# Patient Record
Sex: Male | Born: 1935 | Race: Black or African American | Hispanic: No | Marital: Single | Smoking: Former smoker
Health system: Southern US, Community
[De-identification: ages and names within clinical notes are randomized; demographics above are authoritative.]

## PROBLEM LIST (undated history)

## (undated) DIAGNOSIS — I472 Ventricular tachycardia: Secondary | ICD-10-CM

## (undated) DIAGNOSIS — Z950 Presence of cardiac pacemaker: Secondary | ICD-10-CM

## (undated) DIAGNOSIS — F329 Major depressive disorder, single episode, unspecified: Secondary | ICD-10-CM

## (undated) DIAGNOSIS — I2510 Atherosclerotic heart disease of native coronary artery without angina pectoris: Secondary | ICD-10-CM

## (undated) DIAGNOSIS — N189 Chronic kidney disease, unspecified: Secondary | ICD-10-CM

## (undated) DIAGNOSIS — I714 Abdominal aortic aneurysm, without rupture, unspecified: Secondary | ICD-10-CM

## (undated) DIAGNOSIS — J45909 Unspecified asthma, uncomplicated: Secondary | ICD-10-CM

## (undated) DIAGNOSIS — I4891 Unspecified atrial fibrillation: Secondary | ICD-10-CM

## (undated) DIAGNOSIS — M6281 Muscle weakness (generalized): Secondary | ICD-10-CM

## (undated) DIAGNOSIS — I5022 Chronic systolic (congestive) heart failure: Secondary | ICD-10-CM

## (undated) DIAGNOSIS — M1990 Unspecified osteoarthritis, unspecified site: Secondary | ICD-10-CM

## (undated) DIAGNOSIS — N2581 Secondary hyperparathyroidism of renal origin: Secondary | ICD-10-CM

## (undated) DIAGNOSIS — M109 Gout, unspecified: Secondary | ICD-10-CM

## (undated) DIAGNOSIS — N400 Enlarged prostate without lower urinary tract symptoms: Secondary | ICD-10-CM

## (undated) DIAGNOSIS — E119 Type 2 diabetes mellitus without complications: Secondary | ICD-10-CM

## (undated) DIAGNOSIS — I420 Dilated cardiomyopathy: Secondary | ICD-10-CM

## (undated) DIAGNOSIS — I10 Essential (primary) hypertension: Secondary | ICD-10-CM

## (undated) DIAGNOSIS — J449 Chronic obstructive pulmonary disease, unspecified: Secondary | ICD-10-CM

## (undated) DIAGNOSIS — K649 Unspecified hemorrhoids: Secondary | ICD-10-CM

## (undated) DIAGNOSIS — J189 Pneumonia, unspecified organism: Secondary | ICD-10-CM

## (undated) DIAGNOSIS — F32A Depression, unspecified: Secondary | ICD-10-CM

## (undated) DIAGNOSIS — I4729 Other ventricular tachycardia: Secondary | ICD-10-CM

## (undated) HISTORY — PX: OTHER SURGICAL HISTORY: SHX169

## (undated) HISTORY — DX: Other ventricular tachycardia: I47.29

## (undated) HISTORY — DX: Unspecified osteoarthritis, unspecified site: M19.90

## (undated) HISTORY — DX: Unspecified atrial fibrillation: I48.91

## (undated) HISTORY — DX: Gout, unspecified: M10.9

## (undated) HISTORY — DX: Pneumonia, unspecified organism: J18.9

## (undated) HISTORY — PX: STOMACH SURGERY: SHX791

## (undated) HISTORY — DX: Dilated cardiomyopathy: I42.0

## (undated) HISTORY — DX: Atherosclerotic heart disease of native coronary artery without angina pectoris: I25.10

## (undated) HISTORY — DX: Abdominal aortic aneurysm, without rupture, unspecified: I71.40

## (undated) HISTORY — DX: Unspecified hemorrhoids: K64.9

## (undated) HISTORY — DX: Abdominal aortic aneurysm, without rupture: I71.4

## (undated) HISTORY — PX: CARDIAC CATHETERIZATION: SHX172

## (undated) HISTORY — DX: Unspecified asthma, uncomplicated: J45.909

## (undated) HISTORY — DX: Type 2 diabetes mellitus without complications: E11.9

## (undated) HISTORY — PX: INSERT / REPLACE / REMOVE PACEMAKER: SUR710

## (undated) HISTORY — DX: Essential (primary) hypertension: I10

## (undated) HISTORY — DX: Chronic systolic (congestive) heart failure: I50.22

## (undated) HISTORY — DX: Ventricular tachycardia: I47.2

## (undated) HISTORY — DX: Benign prostatic hyperplasia without lower urinary tract symptoms: N40.0

---

## 2013-07-09 ENCOUNTER — Inpatient Hospital Stay: Payer: Self-pay | Admitting: Specialist

## 2013-07-09 LAB — COMPREHENSIVE METABOLIC PANEL
Albumin: 3.2 g/dL — ABNORMAL LOW (ref 3.4–5.0)
Alkaline Phosphatase: 112 U/L (ref 50–136)
BUN: 39 mg/dL — ABNORMAL HIGH (ref 7–18)
Calcium, Total: 9.1 mg/dL (ref 8.5–10.1)
Chloride: 106 mmol/L (ref 98–107)
EGFR (African American): 53 — ABNORMAL LOW
Osmolality: 290 (ref 275–301)
SGOT(AST): 38 U/L — ABNORMAL HIGH (ref 15–37)
Sodium: 140 mmol/L (ref 136–145)
Total Protein: 7.2 g/dL (ref 6.4–8.2)

## 2013-07-09 LAB — CK TOTAL AND CKMB (NOT AT ARMC)
CK, Total: 169 U/L (ref 35–232)
CK-MB: 8.6 ng/mL — ABNORMAL HIGH (ref 0.5–3.6)

## 2013-07-09 LAB — CBC
HCT: 36 % — ABNORMAL LOW (ref 40.0–52.0)
HGB: 11.5 g/dL — ABNORMAL LOW (ref 13.0–18.0)
MCHC: 32.1 g/dL (ref 32.0–36.0)
RBC: 3.84 10*6/uL — ABNORMAL LOW (ref 4.40–5.90)
RDW: 19.5 % — ABNORMAL HIGH (ref 11.5–14.5)
WBC: 5 10*3/uL (ref 3.8–10.6)

## 2013-07-09 LAB — PRO B NATRIURETIC PEPTIDE: B-Type Natriuretic Peptide: 11556 pg/mL — ABNORMAL HIGH (ref 0–450)

## 2013-07-10 DIAGNOSIS — J9600 Acute respiratory failure, unspecified whether with hypoxia or hypercapnia: Secondary | ICD-10-CM

## 2013-07-10 DIAGNOSIS — I4891 Unspecified atrial fibrillation: Secondary | ICD-10-CM

## 2013-07-10 DIAGNOSIS — I059 Rheumatic mitral valve disease, unspecified: Secondary | ICD-10-CM

## 2013-07-10 LAB — LIPID PANEL
Cholesterol: 115 mg/dL (ref 0–200)
Ldl Cholesterol, Calc: 64 mg/dL (ref 0–100)
Triglycerides: 53 mg/dL (ref 0–200)
VLDL Cholesterol, Calc: 11 mg/dL (ref 5–40)

## 2013-07-10 LAB — TSH: Thyroid Stimulating Horm: 0.816 u[IU]/mL

## 2013-07-10 LAB — BASIC METABOLIC PANEL
Anion Gap: 4 — ABNORMAL LOW (ref 7–16)
Chloride: 105 mmol/L (ref 98–107)
Co2: 31 mmol/L (ref 21–32)
Creatinine: 1.54 mg/dL — ABNORMAL HIGH (ref 0.60–1.30)
EGFR (Non-African Amer.): 42 — ABNORMAL LOW
Glucose: 164 mg/dL — ABNORMAL HIGH (ref 65–99)
Sodium: 140 mmol/L (ref 136–145)

## 2013-07-10 LAB — CBC WITH DIFFERENTIAL/PLATELET
Basophil %: 0.2 %
Lymphocyte #: 0.4 10*3/uL — ABNORMAL LOW (ref 1.0–3.6)
Lymphocyte %: 7.3 %
MCV: 95 fL (ref 80–100)
Monocyte #: 0.3 x10 3/mm (ref 0.2–1.0)
Platelet: 109 10*3/uL — ABNORMAL LOW (ref 150–440)
RDW: 18.5 % — ABNORMAL HIGH (ref 11.5–14.5)

## 2013-07-10 LAB — CK TOTAL AND CKMB (NOT AT ARMC)
CK, Total: 114 U/L (ref 35–232)
CK-MB: 5.6 ng/mL — ABNORMAL HIGH (ref 0.5–3.6)
CK-MB: 6.2 ng/mL — ABNORMAL HIGH (ref 0.5–3.6)

## 2013-07-10 LAB — MAGNESIUM: Magnesium: 2.4 mg/dL

## 2013-07-10 LAB — TROPONIN I: Troponin-I: 0.03 ng/mL

## 2013-07-11 DIAGNOSIS — R079 Chest pain, unspecified: Secondary | ICD-10-CM

## 2013-07-11 LAB — BASIC METABOLIC PANEL
Anion Gap: 3 — ABNORMAL LOW (ref 7–16)
BUN: 36 mg/dL — ABNORMAL HIGH (ref 7–18)
Calcium, Total: 9.1 mg/dL (ref 8.5–10.1)
Co2: 31 mmol/L (ref 21–32)
EGFR (Non-African Amer.): 46 — ABNORMAL LOW
Osmolality: 279 (ref 275–301)
Potassium: 5 mmol/L (ref 3.5–5.1)
Sodium: 134 mmol/L — ABNORMAL LOW (ref 136–145)

## 2013-07-11 LAB — TROPONIN I
Troponin-I: 0.02 ng/mL
Troponin-I: <0.02 ng/mL

## 2013-07-11 LAB — CBC WITH DIFFERENTIAL/PLATELET
Basophil #: 0 10*3/uL (ref 0.0–0.1)
Eosinophil #: 0 10*3/uL (ref 0.0–0.7)
HCT: 33.3 % — ABNORMAL LOW (ref 40.0–52.0)
HGB: 11.1 g/dL — ABNORMAL LOW (ref 13.0–18.0)
Lymphocyte %: 7.5 %
MCH: 31.5 pg (ref 26.0–34.0)
Monocyte %: 3.6 %
Neutrophil #: 4.3 10*3/uL (ref 1.4–6.5)
Neutrophil %: 88.8 %
RDW: 18.4 % — ABNORMAL HIGH (ref 11.5–14.5)

## 2013-07-12 DIAGNOSIS — I509 Heart failure, unspecified: Secondary | ICD-10-CM

## 2013-07-12 LAB — CBC WITH DIFFERENTIAL/PLATELET
Basophil %: 0.1 %
Eosinophil %: 0 %
HGB: 11.2 g/dL — ABNORMAL LOW (ref 13.0–18.0)
Lymphocyte %: 5.6 %
MCHC: 32.9 g/dL (ref 32.0–36.0)
MCV: 95 fL (ref 80–100)
Monocyte %: 3.7 %
RBC: 3.58 10*6/uL — ABNORMAL LOW (ref 4.40–5.90)
RDW: 18.3 % — ABNORMAL HIGH (ref 11.5–14.5)

## 2013-07-12 LAB — BASIC METABOLIC PANEL
Anion Gap: 6 — ABNORMAL LOW (ref 7–16)
BUN: 38 mg/dL — ABNORMAL HIGH (ref 7–18)
Calcium, Total: 9.3 mg/dL (ref 8.5–10.1)
Chloride: 98 mmol/L (ref 98–107)
EGFR (African American): 51 — ABNORMAL LOW
Glucose: 184 mg/dL — ABNORMAL HIGH (ref 65–99)
Sodium: 136 mmol/L (ref 136–145)

## 2013-07-13 LAB — BASIC METABOLIC PANEL
Anion Gap: 2 — ABNORMAL LOW (ref 7–16)
Chloride: 96 mmol/L — ABNORMAL LOW (ref 98–107)
Glucose: 167 mg/dL — ABNORMAL HIGH (ref 65–99)
Osmolality: 284 (ref 275–301)
Potassium: 4.1 mmol/L (ref 3.5–5.1)

## 2013-07-14 LAB — BASIC METABOLIC PANEL
Anion Gap: 3 — ABNORMAL LOW (ref 7–16)
BUN: 39 mg/dL — ABNORMAL HIGH (ref 7–18)
Co2: 41 mmol/L (ref 21–32)
Glucose: 130 mg/dL — ABNORMAL HIGH (ref 65–99)

## 2013-07-15 ENCOUNTER — Telehealth: Payer: Self-pay

## 2013-07-15 DIAGNOSIS — I5021 Acute systolic (congestive) heart failure: Secondary | ICD-10-CM

## 2013-07-15 LAB — BASIC METABOLIC PANEL
Anion Gap: 0 — ABNORMAL LOW (ref 7–16)
BUN: 40 mg/dL — ABNORMAL HIGH (ref 7–18)
Co2: 43 mmol/L (ref 21–32)
Creatinine: 1.35 mg/dL — ABNORMAL HIGH (ref 0.60–1.30)
EGFR (African American): 57 — ABNORMAL LOW
EGFR (Non-African Amer.): 49 — ABNORMAL LOW
Glucose: 196 mg/dL — ABNORMAL HIGH (ref 65–99)
Osmolality: 285 (ref 275–301)
Potassium: 4.1 mmol/L (ref 3.5–5.1)
Sodium: 135 mmol/L — ABNORMAL LOW (ref 136–145)

## 2013-07-15 NOTE — Telephone Encounter (Signed)
Called pt to schedule tcm/ph appt 1-2 wks. /CHF, h/o afib.

## 2013-07-17 ENCOUNTER — Telehealth: Payer: Self-pay | Admitting: *Deleted

## 2013-07-17 NOTE — Telephone Encounter (Signed)
Patient contacted regarding discharge from [facility] on 07/15/13.  Patient understands to follow up with provider Alinda Money PA on 07/22/13 at 1:30 at [facility] office. Patient understands discharge instructions? yes Patient understands medications and regiment? yes Patient understands to bring all medications to this visit? yes  I spoke with patients sister. Mylo Red RN

## 2013-07-21 ENCOUNTER — Encounter: Payer: Self-pay | Admitting: *Deleted

## 2013-07-21 ENCOUNTER — Telehealth: Payer: Self-pay

## 2013-07-21 NOTE — Telephone Encounter (Signed)
Dr Kizzie Ide from [facility] is seeing pt today in office.  Pt was discharged from [facility] on 07/15/13 CHF exacerbation.  Pt's discharge weight was 211 and today in office weight is 192.  Pt's family reports BP 70/40 at home, BP today in office 86/52.  Pt is on Torsemide 20mg  BID, Coreg 3.125mg  BID, Lisinopril 2.5mg  QD, and Amiodarone 200mg  BID.  Pt has appt to see Alinda Money, [place] in office tomorrow. She wants pt to hold Torsemide until seen.  Discussed with Alinda Money and per his instructions pt advised by Dr Kizzie Ide to hold Coreg, Lisinopril, and Torsemide until OV tomorrow afternoon with Alinda Money. We will adjust medications and dosages at OV tomorrow.

## 2013-07-22 ENCOUNTER — Ambulatory Visit (INDEPENDENT_AMBULATORY_CARE_PROVIDER_SITE_OTHER): Payer: Medicare Other | Admitting: Physician Assistant

## 2013-07-22 ENCOUNTER — Encounter: Payer: Self-pay | Admitting: *Deleted

## 2013-07-22 VITALS — BP 68/58 | HR 80 | Ht 70.0 in | Wt 187.8 lb

## 2013-07-22 DIAGNOSIS — I4891 Unspecified atrial fibrillation: Secondary | ICD-10-CM

## 2013-07-22 DIAGNOSIS — R079 Chest pain, unspecified: Secondary | ICD-10-CM

## 2013-07-22 DIAGNOSIS — I959 Hypotension, unspecified: Secondary | ICD-10-CM

## 2013-07-22 DIAGNOSIS — I472 Ventricular tachycardia: Secondary | ICD-10-CM

## 2013-07-22 DIAGNOSIS — I509 Heart failure, unspecified: Secondary | ICD-10-CM

## 2013-07-22 DIAGNOSIS — R0902 Hypoxemia: Secondary | ICD-10-CM

## 2013-07-22 DIAGNOSIS — I5022 Chronic systolic (congestive) heart failure: Secondary | ICD-10-CM | POA: Insufficient documentation

## 2013-07-22 NOTE — Assessment & Plan Note (Addendum)
The patient has diuresed significantly since discharge (209->187 lbs). His edema, orthopnea and PND are much improved but at the expense of hypotension and lightheadedness. He tells me his goal weight previously was 195-200 lbs. He feels "good" in this range. Will plan start sliding scale torsemide to maintain this range. He will take a torsemide for weight >/= 200 lbs, hold for weight </= 195 lbs. Will hold on ACEi, BB and diuretics for the timebeing. Advised to gently hydrate and monitor weights daily. We will see him back in 2 weeks to reassess. He was advised to call for worsening CHF or hypotension symptoms. Check BMET today. He still has significant 2+ pretibial edema on exam. Advised compression stockings to help with diuresis and orthostasis. Will obtain records from prior cardiologist Dr. Fran Lowes in [place], [place] office regarding previous studies, ischemic work-up and type of ICD/pacemaker.

## 2013-07-22 NOTE — Assessment & Plan Note (Addendum)
Per patient. There was no evidence of this on telemetry at [facility]. Will review prior records from [place]. Hold on anticoagulation for the timebeing. NSR on EKG today.

## 2013-07-22 NOTE — Assessment & Plan Note (Addendum)
Intermittent, not associated with exertion. This has been stable for several months per patient. He has a difficult time describing the discomfort. He reports undergoing both a stress test and cardiac cath earlier this year. Will obtain and review prior records prior to pursuing ischemic eval. EKG today unchanged from prior tracings. Continue low-dose ASA for now.

## 2013-07-22 NOTE — Patient Instructions (Addendum)
Please hold off on taking lisinopril and carvedilol until further notice.   Please gently increase fluid intake.   Please limit salt intake to less than 2 grams of sodium per day.   Please wear compression stockings/elevate legs at rest.   Weigh yourself daily. Your goal weight is between 195 lbs and 200 lbs. Do not take torsemide if weight less than 195 lbs. Take torsemide if weight greater than 200 lbs.   Continue to monitor symptoms- shortness of breath, swelling in legs or abdomen, activity level, breathing while laying flat or waking up in the middle of the night short of breath, lightheadedness or blacking out. Call the office if these symptoms develop.   We will check lab work today to assess kidney function and electrolytes.   Please monitor and record your blood pressures 2-3 times per day.  We will see you back in 2 weeks.   2 Gram Low Sodium Diet A 2 gram sodium diet restricts the amount of sodium in the diet to no more than 2 g or 2000 mg daily. Limiting the amount of sodium is often used to help lower blood pressure. It is important if you have heart, liver, or kidney problems. Many foods contain sodium for flavor and sometimes as a preservative. When the amount of sodium in a diet needs to be low, it is important to know what to look for when choosing foods and drinks. The following includes some information and guidelines to help make it easier for you to adapt to a low sodium diet. QUICK TIPS  Do not add salt to food.  Avoid convenience items and fast food.  Choose unsalted snack foods.  Buy lower sodium products, often labeled as "lower sodium" or "no salt added."  Check food labels to learn how much sodium is in 1 serving.  When eating at a restaurant, ask that your food be prepared with less salt or none, if possible. READING FOOD LABELS FOR SODIUM INFORMATION The nutrition facts label is a good place to find how much sodium is in foods. Look for products with no  more than 500 to 600 mg of sodium per meal and no more than 150 mg per serving. Remember that 2 g = 2000 mg. The food label may also list foods as:  Sodium-free: Less than 5 mg in a serving.  Very low sodium: 35 mg or less in a serving.  Low-sodium: 140 mg or less in a serving.  Light in sodium: 50% less sodium in a serving. For example, if a food that usually has 300 mg of sodium is changed to become light in sodium, it will have 150 mg of sodium.  Reduced sodium: 25% less sodium in a serving. For example, if a food that usually has 400 mg of sodium is changed to reduced sodium, it will have 300 mg of sodium. CHOOSING FOODS Grains  Avoid: Salted crackers and snack items. Some cereals, including instant hot cereals. Bread stuffing and biscuit mixes. Seasoned rice or pasta mixes.  Choose: Unsalted snack items. Low-sodium cereals, oats, puffed wheat and rice, shredded wheat. English muffins and bread. Pasta. Meats  Avoid: Salted, canned, smoked, spiced, pickled meats, including fish and poultry. Bacon, ham, sausage, cold cuts, hot dogs, anchovies.  Choose: Low-sodium canned tuna and salmon. Fresh or frozen meat, poultry, and fish. Dairy  Avoid: Processed cheese and spreads. Cottage cheese. Buttermilk and condensed milk. Regular cheese.  Choose: Milk. Low-sodium cottage cheese. Yogurt. Sour cream. Low-sodium cheese. Fruits and  Vegetables  Avoid: Regular canned vegetables. Regular canned tomato sauce and paste. Frozen vegetables in sauces. Olives. Rosita Fire. Relishes. Sauerkraut.  Choose: Low-sodium canned vegetables. Low-sodium tomato sauce and paste. Frozen or fresh vegetables. Fresh and frozen fruit. Condiments  Avoid: Canned and packaged gravies. Worcestershire sauce. Tartar sauce. Barbecue sauce. Soy sauce. Steak sauce. Ketchup. Onion, garlic, and table salt. Meat flavorings and tenderizers.  Choose: Fresh and dried herbs and spices. Low-sodium varieties of mustard and ketchup.  Lemon juice. Tabasco sauce. Horseradish. SAMPLE 2 GRAM SODIUM MEAL PLAN Breakfast / Sodium (mg)  1 cup low-fat milk / 143 mg  2 slices whole-wheat toast / 270 mg  1 tbs heart-healthy margarine / 153 mg  1 hard-boiled egg / 139 mg  1 small orange / 0 mg Lunch / Sodium (mg)  1 cup raw carrots / 76 mg   cup hummus / 298 mg  1 cup low-fat milk / 143 mg   cup red grapes / 2 mg  1 whole-wheat pita bread / 356 mg Dinner / Sodium (mg)  1 cup whole-wheat pasta / 2 mg  1 cup low-sodium tomato sauce / 73 mg  3 oz lean ground beef / 57 mg  1 small side salad (1 cup raw spinach leaves,  cup cucumber,  cup yellow bell pepper) with 1 tsp olive oil and 1 tsp red wine vinegar / 25 mg Snack / Sodium (mg)  1 container low-fat vanilla yogurt / 107 mg  3 graham cracker squares / 127 mg Nutrient Analysis  Calories: 2033  Protein: 77 g  Carbohydrate: 282 g  Fat: 72 g  Sodium: 1971 mg Document Released: 12/11/2005 Document Revised: 03/04/2012 Document Reviewed: 03/14/2010 Exit[employer] Patient Information 2014 [place], [place].

## 2013-07-22 NOTE — Progress Notes (Signed)
Patient ID: [name], male   DOB: 02-21-1930, 79 y.o.   MRN: [id number]            Date:  07/22/2013   ID:  [name], DOB May 17, 1930, MRN [id number]  PCP:  No primary provider on file.  Primary Cardiologist:  Seen in consultation at [facility] with M. Kirke Corin, MD  History of Present Illness:  [name] is a 79 y.o. African-American male who recently moved to [facility] from [place] with a PMHx s/f chronic systolic/biventricular CHF, CAD, h/o atrial fibrillation, CKD (stage III), DM2 (newly diagnosed), AAA, HTN, asthma and BPH who was admitted to [facility] 7/16 to 7/22 for acute on chronic CHF after running out of his home diuretics.   The patient is a very poor historian and partially illiterate. He had followed a cardiologist in [place] for heart failure. He has a cardiac device in place (cannot distinguish if ICD or PM or brand). He affirmed a prior heart attack, but no prior revascularization. He was given months worth of cardiac meds including diuretics prior to his move to [place], but states these were lost during a connecting flight in [place]. For 1-2 weeks after arriving in [place], he noticed progressive weight gain (baseline 195-200 lbs), LE edema, abdominal distention and DOE progressing to resting dyspnea. He did note chest tightness.   He thus presented to [facility]. pBNP was markedly elevated, CXR indicated pulmonary edema and he was noted to be volume overloaded on exam. Admission weight 215 lbs. He was admitted for diuresis and further work-up. 2D echo was obtained and showed bi-ventricular heart failure (EF 25%) and significant MR/TR. Lasix was up-titrated and carvedilol continued. Low-dose ACEi was added. BP was labile precluding the addition of vasodilators. He diuresed well on IV Lasix. He was transitioned to torsemide. Bicarb was up-trending and torsemide was reduced to 20 BID. Renal dysfunction noted to be stable (BUN 40/Cr 1.35, GFR 57) on discharge. Admission was prolonged  by development of a bronchitis treated with ABX. He had frequent short runs of NSVT and amiodarone was started. No evidence of a-fib. Trop-I WNL x 5. Outpatient ischemic work-up recommended.   He did establish with a PCP who noted significant weight loss and hypotension. She called the office yesterday. The recommendation was made to hold Coreg, Torsemide and Lisinopril until follow-up today.   His sister is with him today to supplement the history. He reports significant weight loss since discharge (209 lbs -> 187 lbs today). He notes improved PND, orthopnea, LE and scrotal edema. DOE unchanged (NYHA class III at baseline). He has been receiving home health RN, aide and PT. He has now however had episodes of lightheadedness particularly on standing. Reviewing follow-up appointment with PCP yesterday, home BPs noted to be 70s/40s. No syncope. He denies worsening chest pain. He tells me that he has had both a stress test and heart cath within the past 3-4 months which were "normal." Prior cardiologist "Dr. Fran Lowes in [place], [place]." Sister states his previous records may have been obtained at [facility] office.  LABWORK AT [facility]:  TSH 0.816 07/10/13  LDL 64, HDL 40, TG 53, TC 413 07/10/13  Mg 2.4 07/10/13  Hgb A1C 6.8% 7/10  EKG: sinus tachycardia, 106 bpm, Q waves III, aVF, IVCD, LAD, frequent PVCs, PACs, no ST/T changes  ORTHOSTATIC VITAL SIGNS  Lying 80/60 HR 51  Sitting 76/58 HR 53  Standing 70/52, 68/58 HR 80-95. Legs "gave out" at 5 minutes  Wt Readings from Last 3 Encounters:  07/22/13  187 lb 12 oz (85.163 kg)     Past Medical History  Diagnosis Date  . Hypertension   . Coronary artery disease   . Asthma   . AAA (abdominal aortic aneurysm)   . Benign prostatic hypertrophy   . Arthritis   . Diabetes   . Hemorrhoids   . Gout   . Dilated cardiomyopathy   . Chronic systolic CHF (congestive heart failure)     EF 25%, significant TR/MR on 06/2013 echo  . Atrial fibrillation     Per patient    . NSVT (nonsustained ventricular tachycardia)     At [facility]    Current Outpatient Prescriptions  Medication Sig Dispense Refill  . albuterol (PROVENTIL HFA;VENTOLIN HFA) 108 (90 BASE) MCG/ACT inhaler Inhale 2 puffs into the lungs every 4 (four) hours as needed for wheezing.      Marland Kitchen amiodarone (PACERONE) 200 MG tablet Take 200 mg by mouth 2 (two) times daily.      Marland Kitchen aspirin 81 MG tablet Take 81 mg by mouth daily.      . brimonidine (ALPHAGAN) 0.15 % ophthalmic solution Place 1 drop into both eyes 2 (two) times daily.      . carisoprodol (SOMA) 350 MG tablet Take 350 mg by mouth 3 (three) times daily.      . fluticasone-salmeterol (ADVAIR HFA) 115-21 MCG/ACT inhaler Inhale 2 puffs into the lungs 2 (two) times daily.      Marland Kitchen HYDROcodone-acetaminophen (NORCO) 7.5-325 MG per tablet Take 1 tablet by mouth every 6 (six) hours as needed for pain.      Marland Kitchen latanoprost (XALATAN) 0.005 % ophthalmic solution 1 drop at bedtime.      Marland Kitchen levofloxacin (LEVAQUIN) 500 MG tablet Take 500 mg by mouth daily.      . predniSONE (DELTASONE) 10 MG tablet Take 10 mg by mouth daily.      . tamsulosin (FLOMAX) 0.4 MG CAPS Take 0.4 mg by mouth daily.      . traMADol (ULTRAM) 50 MG tablet Take 50 mg by mouth 2 (two) times daily.      . carvedilol (COREG) 3.125 MG tablet Take 3.125 mg by mouth 2 (two) times daily with a meal.      . lisinopril (PRINIVIL,ZESTRIL) 2.5 MG tablet Take 2.5 mg by mouth daily.      Marland Kitchen torsemide (DEMADEX) 20 MG tablet Take 20 mg by mouth 2 (two) times daily.       No current facility-administered medications for this visit.    Allergies:   No Known Allergies  Social History:  The patient  reports that he has never smoked. He does not have any smokeless tobacco history on file. He reports that he does not drink alcohol or use illicit drugs.   Family History:  Family History  Problem Relation Age of Onset  . Hypertension Mother     Review of Systems: General: negative for chills, fever, night  sweats or weight changes.  Cardiovascular: positive for intermittent for chest pain, dyspnea on exertion, negative for orthopnea, palpitations, paroxysmal nocturnal dyspnea Dermatological: negative for rash Respiratory: negative for cough or wheezing Urologic:  negative for hematuria Abdominal: negative for nausea, vomiting, diarrhea, bright red blood per rectum, melena, or hematemesis Neurologic: positive for lightheadedness, negative for visual changes, syncope, or dizziness All other systems reviewed and are otherwise negative except as noted above.  PHYSICAL EXAM: VS:  BP 68/58  Pulse 80  Ht 5\' 10"  (1.778 m)  Wt 187 lb 12 oz (85.163  kg)  BMI 26.94 kg/m2 O2 sat: 86% on ambulating Elderly appearing African-American male in NAD.  HEENT: normal, PERRL Neck: no JVD or bruits Cardiac: distant heart sounds, normal S1, S2; RRR; no murmur or gallops Lungs: distant breath sounds, clear to auscultation bilaterally, no wheezing, rhonchi or rales Abd: soft, nontender, no hepatomegaly, normoactive BS x 4 quads Ext: 2+ bilateral pretibial pitting edema, no cyanosis or clubbing Skin: warm and dry, cap refill < 2 sec Neuro:  CNs 2-12 intact, no focal abnormalities noted Musculoskeletal: strength and tone appropriate for age  Psych: normal affect

## 2013-07-22 NOTE — Assessment & Plan Note (Addendum)
Suspect over-diuresis. Orthostatic vitals signs positive as well. Plan, as above, will be to maintain a weight of 195-200 lbs. Hold antihypertensives. He has been advised to increase fluid intake and monitor his weights and BP daily. Advised to wear compression stockings. Follow-up in two weeks to re-assess.

## 2013-07-22 NOTE — Assessment & Plan Note (Signed)
86% on ambulating in the office today off portable O2. Will resume home O2 supplementation. Patient has asthma and reports improved respiratory status with home nebulizer treatments. He will follow-up with his PCP for this.

## 2013-07-22 NOTE — Assessment & Plan Note (Addendum)
Continued on amiodarone. Ectopy noted on EKG. Follow-up in 2 weeks. Check LFTs and TSH at that time.

## 2013-07-28 ENCOUNTER — Telehealth: Payer: Self-pay | Admitting: *Deleted

## 2013-07-28 NOTE — Telephone Encounter (Signed)
Hurman Horn PA asked that we obtain a a BMET this week on this patient. He is a difficult stick so would have to be completed at the hospital. When I spoke with the patient's wife she said that he was at [facility] today and had 4 sticks without success. The patient was not home when I called. Therefore they gave him an order for labs to be completed on 8/7 at [facility]. She will make sure that Dr.Gollan receives a copy of this lab work. Will let Alinda Money A PA know.

## 2013-08-01 ENCOUNTER — Other Ambulatory Visit: Payer: Self-pay | Admitting: Nephrology

## 2013-08-01 LAB — CBC WITH DIFFERENTIAL/PLATELET
Basophil #: 0 10*3/uL (ref 0.0–0.1)
HGB: 12.6 g/dL — ABNORMAL LOW (ref 13.0–18.0)
Lymphocyte #: 1.2 10*3/uL (ref 1.0–3.6)
MCH: 31.3 pg (ref 26.0–34.0)
Monocyte #: 0.6 x10 3/mm (ref 0.2–1.0)
Neutrophil #: 3 10*3/uL (ref 1.4–6.5)
Platelet: 108 10*3/uL — ABNORMAL LOW (ref 150–440)
WBC: 4.9 10*3/uL (ref 3.8–10.6)

## 2013-08-01 LAB — RENAL FUNCTION PANEL
Albumin: 3 g/dL — ABNORMAL LOW (ref 3.4–5.0)
Calcium, Total: 9.5 mg/dL (ref 8.5–10.1)
Creatinine: 1.29 mg/dL (ref 0.60–1.30)
EGFR (African American): >60
EGFR (Non-African Amer.): 52 — ABNORMAL LOW
Glucose: 102 mg/dL — ABNORMAL HIGH (ref 65–99)
Phosphorus: 2.7 mg/dL (ref 2.5–4.9)
Potassium: 3.8 mmol/L (ref 3.5–5.1)
Sodium: 136 mmol/L (ref 136–145)

## 2013-08-01 LAB — URIC ACID: Uric Acid: 6.8 mg/dL (ref 3.5–7.2)

## 2013-08-08 LAB — KAPPA/LAMBDA FREE LIGHT CHAINS (ARMC)

## 2013-08-11 ENCOUNTER — Telehealth: Payer: Self-pay

## 2013-08-11 NOTE — Telephone Encounter (Signed)
Pt was in [facility], states he had overload of fluid , ok to give a single dose of furosimide

## 2013-08-12 ENCOUNTER — Telehealth: Payer: Self-pay | Admitting: *Deleted

## 2013-08-12 ENCOUNTER — Ambulatory Visit: Payer: Self-pay | Admitting: Family Medicine

## 2013-08-12 LAB — PROTEIN ELECTROPHORESIS(ARMC)

## 2013-08-12 NOTE — Telephone Encounter (Signed)
[name] with [facility] called. Patient weighs 202. Need orders on Torsomide. Please advise

## 2013-08-12 NOTE — Telephone Encounter (Signed)
"  Weigh yourself daily. Your goal weight is between 195 lbs and 200 lbs. Do not take torsemide if weight less than 195 lbs. Take torsemide if weight greater than 200 lbs.".Marland Kitchenper Tony's note.  Instructed Lynn on Tony's orders, she will relay message to pt's sister.  Pt sched to see Dr. Mariah Milling 8/20 for f/u.

## 2013-08-12 NOTE — Telephone Encounter (Signed)
Torsemide 

## 2013-08-13 ENCOUNTER — Encounter: Payer: Self-pay | Admitting: Cardiovascular Disease

## 2013-08-13 ENCOUNTER — Ambulatory Visit (INDEPENDENT_AMBULATORY_CARE_PROVIDER_SITE_OTHER): Payer: Medicare Other | Admitting: Cardiovascular Disease

## 2013-08-13 VITALS — BP 110/70 | HR 84 | Ht 70.5 in | Wt 206.0 lb

## 2013-08-13 DIAGNOSIS — I2510 Atherosclerotic heart disease of native coronary artery without angina pectoris: Secondary | ICD-10-CM

## 2013-08-13 DIAGNOSIS — I4729 Other ventricular tachycardia: Secondary | ICD-10-CM

## 2013-08-13 DIAGNOSIS — I509 Heart failure, unspecified: Secondary | ICD-10-CM

## 2013-08-13 DIAGNOSIS — I4891 Unspecified atrial fibrillation: Secondary | ICD-10-CM

## 2013-08-13 DIAGNOSIS — I472 Ventricular tachycardia: Secondary | ICD-10-CM

## 2013-08-13 DIAGNOSIS — R0602 Shortness of breath: Secondary | ICD-10-CM

## 2013-08-13 DIAGNOSIS — I5022 Chronic systolic (congestive) heart failure: Secondary | ICD-10-CM

## 2013-08-13 MED ORDER — TORSEMIDE 20 MG PO TABS: 20.0000 mg | ORAL_TABLET | Freq: Two times a day (BID) | ORAL | Status: DC

## 2013-08-13 MED ORDER — POTASSIUM CHLORIDE ER 10 MEQ PO TBCR: 10.0000 meq | EXTENDED_RELEASE_TABLET | Freq: Every day | ORAL | Status: DC

## 2013-08-13 NOTE — Assessment & Plan Note (Signed)
Maintaining normal sinus rhythm 

## 2013-08-13 NOTE — Assessment & Plan Note (Signed)
Recent weight gain of 15 pounds. Suspect his ideal weight will be less than 190 pounds. Probably closer to 185 given that his renal function was in a reasonable range August 8. Encouraged him to restart torsemide 20 mg twice a day, decreasing the dose down to torsemide 20 mg daily for weight less than 190 pounds. Suggested he take this with potassium 10 mEq daily.  I suspect he does not have pneumonia given his clinical presentation. Dullness at the bases concerning for pleural effusion from heart failure. Chest x-ray would likely mistake pleural effusion for pneumonia

## 2013-08-13 NOTE — Assessment & Plan Note (Addendum)
We'll need to obtain his previous records, prior cardiac catheterization report. 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 Is currently not on a statin. We'll discuss with him on his next visit

## 2013-08-13 NOTE — Patient Instructions (Addendum)
Please restart torsemide twice a day (early Am and then 2 pm)  For weight less than 190, take only one torsemide a day  Take with potassium daily  Please call us if you have new issues that need to be addressed before your next appt.  Your physician wants you to follow-up in: 2 weeks

## 2013-08-13 NOTE — Assessment & Plan Note (Signed)
Nonsustained VT seen in the hospital. We'll need to have his device checked in our clinic.

## 2013-08-13 NOTE — Progress Notes (Signed)
Patient ID: [name], male    DOB: [birth year], 79 y.o.   MRN: [id number]  HPI Comments: 79 y.o. African-American male who recently moved to [facility] from [place] with a PMHx s/f chronic systolic/biventricular CHF, CAD, h/o atrial fibrillation, CKD (stage III), DM2 (newly diagnosed), AAA, HTN, asthma and BPH who was admitted to [facility] 7/16 to 07/15/13 for acute on chronic CHF after running out of his home diuretics.    For 1-2 weeks after arriving in [place], he noticed progressive weight gain, LE edema, abdominal distention and DOE progressing to resting dyspnea. He did note chest tightness.    presented to [facility]. pBNP was markedly elevated, CXR indicated pulmonary edema and he was noted to be volume overloaded on exam. Admission weight 215 lbs.  . 2D echo was obtained and showed bi-ventricular heart failure (EF 25%) and significant MR/TR. Lasix was up-titrated and carvedilol continued. Low-dose ACEi was added.   He diuresed well on IV Lasix. He was transitioned to torsemide 20 BID. Renal dysfunction noted to be stable (BUN 40/Cr 1.35, GFR 57) on discharge.   Admission was prolonged by development of a bronchitis treated with ABX. He had frequent short runs of NSVT and amiodarone was started. No evidence of a-fib. Trop-I WNL x 5. Outpatient ischemic work-up recommended.   On his prior clinic visit, he had significant weight loss and hypotension.  recommendation was made to hold Coreg, Torsemide and Lisinopril until follow-up with [place]. Systolic pressure was in the 70s. Weight was down from 209 pounds to 187 pounds. He continued to have edema, though improved. He was having episodes of lightheadedness  Reports having had a stress test and heart cath within the past 3-4 months which were "normal." Prior cardiologist "Dr. Fran Lowes in [place], [place]." Sister states his previous records may have been obtained at [employer] office.    His back up to 206 pounds up from 187 pounds. He has not been taking  torsemide. He was told to hold this for weight less than 200 pounds. At home, his weight has been greater than 200. Uncertain why he is not taking torsemide yet. He does have low-grade cough. Nonproductive, no yellow-colored sputum. No increased cough in general. He was told by primary care that he had pneumonia on chest x-ray  Base metabolic panel was done 08/01/2013. This showed creatinine 1.29, BUN 25, potassium 3.8, hemoglobin A1c 8.0  EKG today shows normal sinus rhythm with rate 84 beats per minute, old anterior MI, left anterior fascicular block   Outpatient Encounter Prescriptions as of 08/13/2013  Medication Sig Dispense Refill  . albuterol (PROVENTIL HFA;VENTOLIN HFA) 108 (90 BASE) MCG/ACT inhaler Inhale 2 puffs into the lungs every 4 (four) hours as needed for wheezing.      Marland Kitchen amiodarone (PACERONE) 200 MG tablet Take 200 mg by mouth 2 (two) times daily.      Marland Kitchen aspirin 81 MG tablet Take 81 mg by mouth daily.      . brimonidine (ALPHAGAN) 0.15 % ophthalmic solution Place 1 drop into both eyes 2 (two) times daily.      . carisoprodol (SOMA) 350 MG tablet Take 350 mg by mouth 3 (three) times daily.      . fluticasone-salmeterol (ADVAIR HFA) 115-21 MCG/ACT inhaler Inhale 2 puffs into the lungs 2 (two) times daily.      Marland Kitchen HYDROcodone-acetaminophen (NORCO) 7.5-325 MG per tablet Take 1 tablet by mouth every 6 (six) hours as needed for pain.      Marland Kitchen latanoprost (XALATAN) 0.005 %  ophthalmic solution 1 drop at bedtime.      . NON FORMULARY Oxygen 3 liters daily.      . tamsulosin (FLOMAX) 0.4 MG CAPS Take 0.4 mg by mouth daily.      Marland Kitchen torsemide (DEMADEX) 20 MG tablet Take 1 tablet (20 mg total) by mouth 2 (two) times daily.  180 tablet  6  . traMADol (ULTRAM) 50 MG tablet Take 50 mg by mouth 2 (two) times daily.      . [DISCONTINUED] torsemide (DEMADEX) 20 MG tablet Take 20 mg by mouth 2 (two) times daily.      . carvedilol (COREG) 3.125 MG tablet Take 3.125 mg by mouth 2 (two) times daily with a  meal.      . lisinopril (PRINIVIL,ZESTRIL) 2.5 MG tablet Take 2.5 mg by mouth daily.      . potassium chloride (K-DUR) 10 MEQ tablet Take 1 tablet (10 mEq total) by mouth daily.  30 tablet  6    Review of Systems  Constitutional: Negative.   HENT: Negative.   Eyes: Negative.   Respiratory: Positive for cough and shortness of breath.   Cardiovascular: Positive for leg swelling.  Gastrointestinal: Negative.   Musculoskeletal: Negative.   Skin: Negative.   Neurological: Negative.   Psychiatric/Behavioral: Negative.   All other systems reviewed and are negative.    BP 110/70  Pulse 84  Ht 5' 10.5" (1.791 m)  Wt 206 lb (93.441 kg)  BMI 29.13 kg/m2  Physical Exam  Nursing note and vitals reviewed. Constitutional: He is oriented to person, place, and time. He appears well-developed and well-nourished.  HENT:  Head: Normocephalic.  Nose: Nose normal.  Mouth/Throat: Oropharynx is clear and moist.  Eyes: Conjunctivae are normal. Pupils are equal, round, and reactive to light.  Neck: Normal range of motion. Neck supple. No JVD present.  Cardiovascular: Normal rate, regular rhythm, S1 normal, S2 normal and intact distal pulses.  Exam reveals no gallop and no friction rub.   Murmur heard.  Crescendo systolic murmur is present with a grade of 2/6  Pulmonary/Chest: Effort normal and breath sounds normal. No respiratory distress. He has no wheezes. He has no rales. He exhibits no tenderness.  Dullness at the bases bilaterally one third way up, rales  Abdominal: Soft. Bowel sounds are normal. He exhibits no distension. There is no tenderness.  Musculoskeletal: Normal range of motion. He exhibits no edema and no tenderness.  Lymphadenopathy:    He has no cervical adenopathy.  Neurological: He is alert and oriented to person, place, and time. Coordination normal.  Skin: Skin is warm and dry. No rash noted. No erythema.  Psychiatric: He has a normal mood and affect. His behavior is normal.  Judgment and thought content normal.      Assessment and Plan

## 2013-08-29 ENCOUNTER — Encounter: Payer: Self-pay | Admitting: Cardiovascular Disease

## 2013-08-29 ENCOUNTER — Encounter: Payer: Self-pay | Admitting: *Deleted

## 2013-08-29 ENCOUNTER — Ambulatory Visit (INDEPENDENT_AMBULATORY_CARE_PROVIDER_SITE_OTHER): Payer: Medicare Other | Admitting: Cardiovascular Disease

## 2013-08-29 VITALS — BP 88/58 | HR 68 | Ht 70.5 in | Wt 196.0 lb

## 2013-08-29 DIAGNOSIS — Z950 Presence of cardiac pacemaker: Secondary | ICD-10-CM

## 2013-08-29 DIAGNOSIS — R079 Chest pain, unspecified: Secondary | ICD-10-CM

## 2013-08-29 DIAGNOSIS — I959 Hypotension, unspecified: Secondary | ICD-10-CM

## 2013-08-29 DIAGNOSIS — I509 Heart failure, unspecified: Secondary | ICD-10-CM

## 2013-08-29 DIAGNOSIS — I2510 Atherosclerotic heart disease of native coronary artery without angina pectoris: Secondary | ICD-10-CM

## 2013-08-29 DIAGNOSIS — I5022 Chronic systolic (congestive) heart failure: Secondary | ICD-10-CM

## 2013-08-29 DIAGNOSIS — I472 Ventricular tachycardia: Secondary | ICD-10-CM

## 2013-08-29 NOTE — Assessment & Plan Note (Signed)
Unclear if he has nonischemic cardiopathy ischemic. Again we have requested previous records

## 2013-08-29 NOTE — Assessment & Plan Note (Addendum)
Previous admission to the hospital in the setting of systolic CHF, found to have short runs of nonsustained VT. We'll need to have his pacemaker evaluated to determine if additional arrhythmia can be noted. He denies having any symptoms. Unable to advance his beta blockers given his bradycardia

## 2013-08-29 NOTE — Assessment & Plan Note (Signed)
Asymptomatic from his hypotension. We'll watch him closely. For any lightheadedness, dizziness, may need to hold his lisinopril.

## 2013-08-29 NOTE — Progress Notes (Signed)
Patient ID: [name], male    DOB: 10-May-1930, 79 y.o.   MRN: [id number]  HPI Comments: 79 y.o. African-American male who recently moved to [facility] from [place] with a PMHx of chronic systolic/biventricular CHF, CAD, h/o atrial fibrillation, CKD (stage III), DM2 (newly diagnosed), AAA, HTN, asthma and BPH who was admitted to [facility] 7/16 to 07/15/13 for acute on chronic CHF after running out of his home diuretics.   pBNP was markedly elevated, CXR indicated pulmonary edema, Admission weight 215 lbs.   2D echo showed bi-ventricular heart failure (EF 25%) and significant MR/TR.  Lasix was up-titrated and carvedilol continued. Low-dose ACEi was added.  He was transitioned to torsemide 20 BID. Renal dysfunction noted to be stable (BUN 40/Cr 1.35, GFR 57) on discharge.   Admission was prolonged by development of a bronchitis treated with ABX. He had frequent short runs of NSVT and amiodarone was started. No evidence of a-fib. Trop-I WNL x 5. Outpatient ischemic work-up recommended.   On his last clinic visit, his weight was 206 pounds with weight gain. Weight is now down to 196 pounds. Less leg edema, less shortness of breath. Overall feels well His wife reports that he continues to drink significant [facility] and other drinks. He is only taking torsemide once a day, was previously recommended he take torsemide twice a day until weight was 190 pounds.  He is followed by Dr. Thedore Mins, renal service He reports that he takes nitroglycerin for occasional chest tightness.  Reports having had a stress test and heart cath recently which were "normal." Prior cardiologist "Dr. Fran Lowes in [place], [place]."  Old records were not received.  Base metabolic panel was done 08/01/2013. This showed creatinine 1.29, BUN 25, potassium 3.8, hemoglobin A1c 8.0    EKG today shows normal sinus rhythm with rate 68 beats per minute, left anterior fascicular block, nonspecific ST and T wave abnormality   Outpatient  Encounter Prescriptions as of 08/29/2013  Medication Sig Dispense Refill  . albuterol (PROVENTIL HFA;VENTOLIN HFA) 108 (90 BASE) MCG/ACT inhaler Inhale 2 puffs into the lungs every 4 (four) hours as needed for wheezing.      Marland Kitchen amiodarone (PACERONE) 200 MG tablet Take 200 mg by mouth 2 (two) times daily.      Marland Kitchen aspirin 81 MG tablet Take 81 mg by mouth daily.      . brimonidine (ALPHAGAN) 0.15 % ophthalmic solution Place 1 drop into both eyes 2 (two) times daily.      . carisoprodol (SOMA) 350 MG tablet Take 350 mg by mouth 3 (three) times daily.      . fluticasone-salmeterol (ADVAIR HFA) 115-21 MCG/ACT inhaler Inhale 2 puffs into the lungs 2 (two) times daily.      Marland Kitchen HYDROcodone-acetaminophen (NORCO) 7.5-325 MG per tablet Take 1 tablet by mouth every 6 (six) hours as needed for pain.      Marland Kitchen latanoprost (XALATAN) 0.005 % ophthalmic solution 1 drop at bedtime.      Marland Kitchen lisinopril (PRINIVIL,ZESTRIL) 2.5 MG tablet Take 2.5 mg by mouth daily.      . NON FORMULARY Oxygen 3 liters daily.      . potassium chloride (K-DUR) 10 MEQ tablet Take 1 tablet (10 mEq total) by mouth daily.  30 tablet  6  . tamsulosin (FLOMAX) 0.4 MG CAPS Take 0.4 mg by mouth daily.      Marland Kitchen torsemide (DEMADEX) 20 MG tablet Take 1 tablet (20 mg total) by mouth 2 (two) times daily.  180 tablet  6  . traMADol (ULTRAM) 50 MG tablet Take 50 mg by mouth 2 (two) times daily.      . carvedilol (COREG) 3.125 MG tablet Take 3.125 mg by mouth 2 (two) times daily with a meal.        Review of Systems  Constitutional: Negative.   HENT: Negative.   Eyes: Negative.   Respiratory: Positive for chest tightness.   Cardiovascular: Positive for leg swelling.  Gastrointestinal: Negative.   Musculoskeletal: Negative.   Skin: Negative.   Neurological: Negative.   Psychiatric/Behavioral: Negative.   All other systems reviewed and are negative.    BP 88/58  Pulse 68  Ht 5' 10.5" (1.791 m)  Wt 196 lb (88.905 kg)  BMI 27.72 kg/m2  Physical Exam   Nursing note and vitals reviewed. Constitutional: He is oriented to person, place, and time. He appears well-developed and well-nourished.  HENT:  Head: Normocephalic.  Nose: Nose normal.  Mouth/Throat: Oropharynx is clear and moist.  Eyes: Conjunctivae are normal. Pupils are equal, round, and reactive to light.  Neck: Normal range of motion. Neck supple. No JVD present.  Cardiovascular: Normal rate, regular rhythm, S1 normal, S2 normal and intact distal pulses.  Exam reveals no gallop and no friction rub.   Murmur heard.  Crescendo systolic murmur is present with a grade of 2/6  Pulmonary/Chest: Effort normal and breath sounds normal. No respiratory distress. He has no wheezes. He has no rales. He exhibits no tenderness.  Dullness at the bases bilaterally one third way up, rales  Abdominal: Soft. Bowel sounds are normal. He exhibits no distension. There is no tenderness.  Musculoskeletal: Normal range of motion. He exhibits no edema and no tenderness.  Lymphadenopathy:    He has no cervical adenopathy.  Neurological: He is alert and oriented to person, place, and time. Coordination normal.  Skin: Skin is warm and dry. No rash noted. No erythema.  Psychiatric: He has a normal mood and affect. His behavior is normal. Judgment and thought content normal.      Assessment and Plan

## 2013-08-29 NOTE — Patient Instructions (Addendum)
You are doing well. You still have extra fluid in your legs  Please continue to watch the weight Goal weight is less than 190 pounds  Try two diuretic pills a couple days a week   We will schedule an appt with Dr. Graciela Husbands for pacemaker, establish care  Please call us if you have new issues that need to be addressed before your next appt.  Your physician wants you to follow-up in: 3 months.

## 2013-08-29 NOTE — Assessment & Plan Note (Signed)
He reports having a [employer] pacemaker. We will schedule him to be seen in our clinic in [place] to establish care. We'll try to obtain previous records.

## 2013-08-29 NOTE — Assessment & Plan Note (Signed)
Recommended he take torsemide twice a day until weight is less than 190 pounds. Then use extra p.m. torsemide as needed to maintain his weight less than 190

## 2013-08-29 NOTE — Assessment & Plan Note (Signed)
Chest pain is atypical in nature. We'll try to obtain his previous records to clarify whether he has underlying CAD. These were requested today.

## 2013-09-10 ENCOUNTER — Encounter: Payer: Medicare Other | Admitting: Internal Medicine

## 2013-09-10 ENCOUNTER — Ambulatory Visit: Payer: Medicare Other | Admitting: Internal Medicine

## 2013-09-25 ENCOUNTER — Encounter: Payer: Self-pay | Admitting: Internal Medicine

## 2013-09-25 ENCOUNTER — Ambulatory Visit (INDEPENDENT_AMBULATORY_CARE_PROVIDER_SITE_OTHER): Payer: Medicare Other | Admitting: Internal Medicine

## 2013-09-25 ENCOUNTER — Ambulatory Visit: Payer: Medicare Other | Admitting: Internal Medicine

## 2013-09-25 VITALS — BP 106/67 | HR 77 | Ht 71.0 in | Wt 189.0 lb

## 2013-09-25 DIAGNOSIS — I255 Ischemic cardiomyopathy: Secondary | ICD-10-CM

## 2013-09-25 DIAGNOSIS — I5022 Chronic systolic (congestive) heart failure: Secondary | ICD-10-CM

## 2013-09-25 DIAGNOSIS — R079 Chest pain, unspecified: Secondary | ICD-10-CM

## 2013-09-25 DIAGNOSIS — I4891 Unspecified atrial fibrillation: Secondary | ICD-10-CM

## 2013-09-25 DIAGNOSIS — I2589 Other forms of chronic ischemic heart disease: Secondary | ICD-10-CM

## 2013-09-25 DIAGNOSIS — I2510 Atherosclerotic heart disease of native coronary artery without angina pectoris: Secondary | ICD-10-CM

## 2013-09-25 DIAGNOSIS — I472 Ventricular tachycardia: Secondary | ICD-10-CM

## 2013-09-25 DIAGNOSIS — Z95810 Presence of automatic (implantable) cardiac defibrillator: Secondary | ICD-10-CM

## 2013-09-25 DIAGNOSIS — I509 Heart failure, unspecified: Secondary | ICD-10-CM

## 2013-09-25 NOTE — Patient Instructions (Signed)
Your physician has recommended you make the following change in your medication:  1) Increase torsemide (demadex) to 20 mg two tablets in the morning and one tablet in the evening for 7 days, then resume one tablet by mouth twice daily.  Your physician wants you to follow-up in: 3 months with Dr. Graciela Husbands. You will receive a reminder letter in the mail two months in advance. If you don't receive a letter, please call our office to schedule the follow-up appointment.

## 2013-09-25 NOTE — Assessment & Plan Note (Signed)
The patient has recurrent ventricular tachycardia treated the history later. We'll continue his amiodarone for now at the current dose. We have reprogrammed his defibrillator to try to decrease the likelihood of shock therapy for slow ventricular tachycardia.
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
## 2013-09-25 NOTE — Assessment & Plan Note (Signed)
stable °

## 2013-09-25 NOTE — Assessment & Plan Note (Signed)
No intercurrent atrial fibrillation 

## 2013-09-25 NOTE — Assessment & Plan Note (Signed)
There is evidence of volume overload; we'll increase his diuretics from 20/20--40/20 for one week. He is scheduled to follow up with Dr. Knute Neu later this month.  He had normal renal function in August. It was notable that his bicarbonate was over 40.

## 2013-09-25 NOTE — Assessment & Plan Note (Signed)
We reviewed extensively the history of his heart disease. I reviewed with him catheterization information, stenting information as well as his arrhythmia history. He seemed appreciative of the understanding . We'll continue current medications.

## 2013-09-25 NOTE — Assessment & Plan Note (Signed)
The patient's device was interrogated and the information was fully reviewed.  The device was reprogrammed to  To provide more pacing for ventricular tachycardia prior shock therapy, increase in NID.

## 2013-10-02 ENCOUNTER — Inpatient Hospital Stay: Payer: Self-pay | Admitting: Internal Medicine

## 2013-10-02 DIAGNOSIS — I2510 Atherosclerotic heart disease of native coronary artery without angina pectoris: Secondary | ICD-10-CM

## 2013-10-02 DIAGNOSIS — I509 Heart failure, unspecified: Secondary | ICD-10-CM

## 2013-10-02 DIAGNOSIS — I5043 Acute on chronic combined systolic (congestive) and diastolic (congestive) heart failure: Secondary | ICD-10-CM

## 2013-10-02 LAB — URINALYSIS, COMPLETE
Bilirubin,UR: NEGATIVE
Glucose,UR: NEGATIVE mg/dL (ref 0–75)
Hyaline Cast: 19
Leukocyte Esterase: NEGATIVE
Nitrite: NEGATIVE

## 2013-10-02 LAB — COMPREHENSIVE METABOLIC PANEL
Alkaline Phosphatase: 68 U/L (ref 50–136)
Anion Gap: 8 (ref 7–16)
BUN: 16 mg/dL (ref 7–18)
Calcium, Total: 9.5 mg/dL (ref 8.5–10.1)
Chloride: 97 mmol/L — ABNORMAL LOW (ref 98–107)
Creatinine: 1.46 mg/dL — ABNORMAL HIGH (ref 0.60–1.30)
EGFR (African American): 52 — ABNORMAL LOW
EGFR (Non-African Amer.): 44 — ABNORMAL LOW
Glucose: 162 mg/dL — ABNORMAL HIGH (ref 65–99)
SGOT(AST): 49 U/L — ABNORMAL HIGH (ref 15–37)
SGPT (ALT): 27 U/L (ref 12–78)
Sodium: 132 mmol/L — ABNORMAL LOW (ref 136–145)

## 2013-10-02 LAB — CBC
HCT: 40.2 % (ref 40.0–52.0)
HGB: 13.4 g/dL (ref 13.0–18.0)
MCH: 32.5 pg (ref 26.0–34.0)
Platelet: 161 10*3/uL (ref 150–440)
RBC: 4.12 10*6/uL — ABNORMAL LOW (ref 4.40–5.90)
RDW: 16.1 % — ABNORMAL HIGH (ref 11.5–14.5)
WBC: 6.1 10*3/uL (ref 3.8–10.6)

## 2013-10-02 LAB — DRUG SCREEN, URINE
Amphetamines, Ur Screen: NEGATIVE (ref ?–1000)
Barbiturates, Ur Screen: NEGATIVE (ref ?–200)
Benzodiazepine, Ur Scrn: NEGATIVE (ref ?–200)
Cannabinoid 50 Ng, Ur ~~LOC~~: NEGATIVE (ref ?–50)
Cocaine Metabolite,Ur ~~LOC~~: NEGATIVE (ref ?–300)
Methadone, Ur Screen: NEGATIVE (ref ?–300)
Opiate, Ur Screen: NEGATIVE (ref ?–300)
Phencyclidine (PCP) Ur S: NEGATIVE (ref ?–25)
Tricyclic, Ur Screen: NEGATIVE (ref ?–1000)

## 2013-10-02 LAB — TROPONIN I: Troponin-I: 0.02 ng/mL

## 2013-10-02 LAB — PROTIME-INR
INR: 1.1
Prothrombin Time: 14.4 secs (ref 11.5–14.7)

## 2013-10-02 LAB — CK TOTAL AND CKMB (NOT AT ARMC)
CK, Total: 122 U/L (ref 35–232)
CK-MB: 6.1 ng/mL — ABNORMAL HIGH (ref 0.5–3.6)

## 2013-10-02 LAB — PRO B NATRIURETIC PEPTIDE: B-Type Natriuretic Peptide: 8340 pg/mL — ABNORMAL HIGH (ref 0–450)

## 2013-10-03 DIAGNOSIS — I5021 Acute systolic (congestive) heart failure: Secondary | ICD-10-CM

## 2013-10-03 LAB — CBC WITH DIFFERENTIAL/PLATELET
Basophil #: 0 10*3/uL (ref 0.0–0.1)
Basophil %: 0.1 %
Eosinophil #: 0 10*3/uL (ref 0.0–0.7)
HCT: 37.3 % — ABNORMAL LOW (ref 40.0–52.0)
HGB: 12.6 g/dL — ABNORMAL LOW (ref 13.0–18.0)
Lymphocyte #: 0.6 10*3/uL — ABNORMAL LOW (ref 1.0–3.6)
Lymphocyte %: 9.8 %
MCH: 32.3 pg (ref 26.0–34.0)
MCV: 96 fL (ref 80–100)
Monocyte #: 0.1 x10 3/mm — ABNORMAL LOW (ref 0.2–1.0)
Monocyte %: 2.4 %
Platelet: 97 10*3/uL — ABNORMAL LOW (ref 150–440)
RBC: 3.91 10*6/uL — ABNORMAL LOW (ref 4.40–5.90)
WBC: 5.9 10*3/uL (ref 3.8–10.6)

## 2013-10-03 LAB — BASIC METABOLIC PANEL
Anion Gap: 3 — ABNORMAL LOW (ref 7–16)
BUN: 21 mg/dL — ABNORMAL HIGH (ref 7–18)
Calcium, Total: 9.8 mg/dL (ref 8.5–10.1)
Chloride: 95 mmol/L — ABNORMAL LOW (ref 98–107)
Co2: 33 mmol/L — ABNORMAL HIGH (ref 21–32)
Creatinine: 1.42 mg/dL — ABNORMAL HIGH (ref 0.60–1.30)
EGFR (Non-African Amer.): 46 — ABNORMAL LOW
Glucose: 160 mg/dL — ABNORMAL HIGH (ref 65–99)
Osmolality: 269 (ref 275–301)
Potassium: 5 mmol/L (ref 3.5–5.1)
Sodium: 131 mmol/L — ABNORMAL LOW (ref 136–145)

## 2013-10-03 LAB — CK-MB
CK-MB: 4.9 ng/mL — ABNORMAL HIGH (ref 0.5–3.6)
CK-MB: 3 ng/mL (ref 0.5–3.6)

## 2013-10-03 LAB — TROPONIN I: Troponin-I: 0.06 ng/mL — ABNORMAL HIGH

## 2013-10-04 DIAGNOSIS — I5033 Acute on chronic diastolic (congestive) heart failure: Secondary | ICD-10-CM

## 2013-10-06 ENCOUNTER — Telehealth: Payer: Self-pay

## 2013-10-06 NOTE — Telephone Encounter (Signed)
Patient contacted regarding discharge from [facility] on 10/04/13.  Patient understands to follow up with provider Dr. Mariah Milling on 10/10/13 at 3:30 at [facility]. Patient understands discharge instructions? yes Patient understands medications and regiment? yes Patient understands to bring all medications to this visit? yes

## 2013-10-10 ENCOUNTER — Encounter: Payer: Self-pay | Admitting: Cardiovascular Disease

## 2013-10-10 ENCOUNTER — Ambulatory Visit (INDEPENDENT_AMBULATORY_CARE_PROVIDER_SITE_OTHER): Payer: Medicare Other | Admitting: Cardiovascular Disease

## 2013-10-10 VITALS — BP 98/70 | HR 80 | Ht 70.0 in | Wt 186.0 lb

## 2013-10-10 DIAGNOSIS — I959 Hypotension, unspecified: Secondary | ICD-10-CM

## 2013-10-10 DIAGNOSIS — J449 Chronic obstructive pulmonary disease, unspecified: Secondary | ICD-10-CM | POA: Insufficient documentation

## 2013-10-10 DIAGNOSIS — I5022 Chronic systolic (congestive) heart failure: Secondary | ICD-10-CM

## 2013-10-10 DIAGNOSIS — R079 Chest pain, unspecified: Secondary | ICD-10-CM

## 2013-10-10 MED ORDER — TIOTROPIUM BROMIDE MONOHYDRATE 18 MCG IN CAPS: 18.0000 ug | ORAL_CAPSULE | Freq: Every day | RESPIRATORY_TRACT | Status: AC

## 2013-10-10 MED ORDER — NITROGLYCERIN 0.4 MG SL SUBL: 0.4000 mg | SUBLINGUAL_TABLET | SUBLINGUAL | Status: DC | PRN

## 2013-10-10 MED ORDER — FLUTICASONE-SALMETEROL 115-21 MCG/ACT IN AERO: 2.0000 | INHALATION_SPRAY | Freq: Two times a day (BID) | RESPIRATORY_TRACT | Status: DC

## 2013-10-10 MED ORDER — ALBUTEROL SULFATE HFA 108 (90 BASE) MCG/ACT IN AERS: 2.0000 | INHALATION_SPRAY | RESPIRATORY_TRACT | Status: DC | PRN

## 2013-10-10 NOTE — Progress Notes (Signed)
Patient ID: [name], male    DOB: [birth year], 79 y.o.   MRN: [id number]  HPI Comments: 79 y.o. African-American male who recently moved to [address] [facility] from [place] with a PMHx of chronic systolic/biventricular CHF, CAD, h/o atrial fibrillation, CKD (stage III), DM2 (newly diagnosed), AAA, HTN, asthma and BPH who was admitted to [facility] 7/16 to 07/15/13 for acute on chronic CHF after running out of his home diuretics.   Readmission to the hospital 10/02/2013 for similar symptoms of acute on chronic systolic CHF. BNP was 8340 CT scan of the chest showed coronary artery disease, cardiomegaly, small pleural effusions, COPD  Echocardiogram 07/10/2013 shows ejection fraction 20-25%, mild-to-moderate aortic regurg, moderate to severe MR severe TR Medication compliance was an issue, also drinking too much fluids he was treated with IV Lasix with improvement of his symptoms. He presents for followup today and reports that he is doing well. He does have a low-grade chronic cough..    in the past,He had frequent short runs of NSVT and amiodarone was started.  He has atypical chest pain, worse after he coughs. He is requesting nitroglycerin   Reports having had a stress test and heart cath recently which were "normal." Prior cardiologist "Dr. Fran Lowes in [place], [place]."  Old records were not received.  Base metabolic panel was done 08/01/2013. This showed creatinine 1.29, BUN 25, potassium 3.8, hemoglobin A1c 8.0    EKG today shows normal sinus rhythm with rate 80 beats per minute, left anterior fascicular block, nonspecific ST and T wave abnormality   Outpatient Encounter Prescriptions as of 10/10/2013  Medication Sig Dispense Refill  . albuterol (PROVENTIL HFA;VENTOLIN HFA) 108 (90 BASE) MCG/ACT inhaler Inhale 2 puffs into the lungs every 4 (four) hours as needed for wheezing.  1 Inhaler  6  . amiodarone (PACERONE) 200 MG tablet Take 200 mg by mouth 2 (two) times daily.      Marland Kitchen aspirin 81 MG tablet  Take 81 mg by mouth daily.      . brimonidine (ALPHAGAN) 0.15 % ophthalmic solution Place 1 drop into both eyes 2 (two) times daily.      . carisoprodol (SOMA) 350 MG tablet Take 350 mg by mouth as needed.       . carvedilol (COREG) 3.125 MG tablet Take 3.125 mg by mouth 2 (two) times daily with a meal.      . fluticasone-salmeterol (ADVAIR HFA) 115-21 MCG/ACT inhaler Inhale 2 puffs into the lungs 2 (two) times daily.  1 Inhaler  11  . HYDROcodone-acetaminophen (NORCO) 7.5-325 MG per tablet Take 1 tablet by mouth every 6 (six) hours as needed for pain.      Marland Kitchen latanoprost (XALATAN) 0.005 % ophthalmic solution 1 drop at bedtime.      Marland Kitchen lisinopril (PRINIVIL,ZESTRIL) 2.5 MG tablet Take 2.5 mg by mouth daily.      . NON FORMULARY Oxygen 3 liters daily.      . potassium chloride (K-DUR) 10 MEQ tablet Take 1 tablet (10 mEq total) by mouth daily.  30 tablet  6  . predniSONE (STERAPRED UNI-PAK) 10 MG tablet Take 10 mg by mouth daily.       . tamsulosin (FLOMAX) 0.4 MG CAPS Take 0.4 mg by mouth daily.      Marland Kitchen tiotropium (SPIRIVA HANDIHALER) 18 MCG inhalation capsule Place 1 capsule (18 mcg total) into inhaler and inhale daily.  30 capsule  6  . torsemide (DEMADEX) 20 MG tablet Take 1 tablet (20 mg total) by mouth 2 (two)  times daily.  180 tablet  6  . traMADol (ULTRAM) 50 MG tablet Take 50 mg by mouth as needed.       . [DISCONTINUED] albuterol (PROVENTIL HFA;VENTOLIN HFA) 108 (90 BASE) MCG/ACT inhaler Inhale 2 puffs into the lungs every 4 (four) hours as needed for wheezing.      . [DISCONTINUED] fluticasone-salmeterol (ADVAIR HFA) 115-21 MCG/ACT inhaler Inhale 2 puffs into the lungs 2 (two) times daily.      . [DISCONTINUED] SPIRIVA HANDIHALER 18 MCG inhalation capsule Place 18 mcg into inhaler and inhale daily.       . nitroGLYCERIN (NITROSTAT) 0.4 MG SL tablet Place 1 tablet (0.4 mg total) under the tongue every 5 (five) minutes as needed for chest pain.  25 tablet  3   No facility-administered  encounter medications on file as of 10/10/2013.    Review of Systems  Constitutional: Negative.   HENT: Negative.   Eyes: Negative.   Respiratory: Positive for cough.   Gastrointestinal: Negative.   Endocrine: Negative.   Musculoskeletal: Negative.   Skin: Negative.   Allergic/Immunologic: Negative.   Neurological: Negative.   Hematological: Negative.   Psychiatric/Behavioral: Negative.   All other systems reviewed and are negative.    BP 98/70  Pulse 80  Ht 5\' 10"  (1.778 m)  Wt 186 lb (84.369 kg)  BMI 26.69 kg/m2  Physical Exam  Nursing note and vitals reviewed. Constitutional: He is oriented to person, place, and time. He appears well-developed and well-nourished.  HENT:  Head: Normocephalic.  Nose: Nose normal.  Mouth/Throat: Oropharynx is clear and moist.  Eyes: Conjunctivae are normal. Pupils are equal, round, and reactive to light.  Neck: Normal range of motion. Neck supple. No JVD present.  Cardiovascular: Normal rate, regular rhythm, S1 normal, S2 normal and intact distal pulses.  Exam reveals no gallop and no friction rub.   Murmur heard.  Crescendo systolic murmur is present with a grade of 2/6  Pulmonary/Chest: Effort normal and breath sounds normal. No respiratory distress. He has no wheezes. He has no rales. He exhibits no tenderness.  Dullness at the bases bilaterally one third way up, rales  Abdominal: Soft. Bowel sounds are normal. He exhibits no distension. There is no tenderness.  Musculoskeletal: Normal range of motion. He exhibits no edema and no tenderness.  Lymphadenopathy:    He has no cervical adenopathy.  Neurological: He is alert and oriented to person, place, and time. Coordination normal.  Skin: Skin is warm and dry. No rash noted. No erythema.  Psychiatric: He has a normal mood and affect. His behavior is normal. Judgment and thought content normal.      Assessment and Plan

## 2013-10-10 NOTE — Assessment & Plan Note (Signed)
Several recent episodes of acute on chronic systolic heart failure. Encouraged compliance with his torsemide, limit his by mouth fluid intake

## 2013-10-10 NOTE — Assessment & Plan Note (Signed)
Atypical chest pain, worse with coughing. He is requesting nitroglycerin.

## 2013-10-10 NOTE — Assessment & Plan Note (Signed)
He is on albuterol, Advair. We will add Spiriva

## 2013-10-10 NOTE — Assessment & Plan Note (Signed)
Blood pressure borderline low. He is relatively asymptomatic. We'll monitor for now

## 2013-10-10 NOTE — Patient Instructions (Signed)
You are doing well. No medication changes were made.  Please call us if you have new issues that need to be addressed before your next appt.  Your physician wants you to follow-up in: 6 months.  You will receive a reminder letter in the mail two months in advance. If you don't receive a letter, please call our office to schedule the follow-up appointment.   

## 2013-10-15 ENCOUNTER — Encounter: Payer: Self-pay | Admitting: *Deleted

## 2013-10-22 ENCOUNTER — Telehealth: Payer: Self-pay

## 2013-10-22 NOTE — Telephone Encounter (Signed)
Would call patient to make sure he is not having lightheadedness or dizziness.  He is on minimal doses of Coreg and ACE inhibitor. Also on diuretic If no symptoms at baseline, with activity, no medication changes at this time. Okay for blood pressure to run low in the setting of severe cardiopathy

## 2013-10-22 NOTE — Telephone Encounter (Signed)
Spoke w/ pt's wife.  She states that pt is fine, the "just tripped and fell". She will call us if he has any other symptoms or complaints.  She states that she will definitely give [place] a call.

## 2013-10-22 NOTE — Telephone Encounter (Signed)
Dr. Verdell Face called stating that pt fell on Monday night, "it was dark and he missed a step". EMS was called and BP was 85/45.   She wanted to make Dr. Mariah Milling aware in case he wants to change medications or have pt come in. She would like for [place] to call pt with recommendations.

## 2013-10-24 ENCOUNTER — Ambulatory Visit: Payer: Self-pay | Admitting: Family Medicine

## 2013-11-03 DIAGNOSIS — I959 Hypotension, unspecified: Secondary | ICD-10-CM

## 2013-11-03 DIAGNOSIS — I509 Heart failure, unspecified: Secondary | ICD-10-CM

## 2013-11-03 DIAGNOSIS — J449 Chronic obstructive pulmonary disease, unspecified: Secondary | ICD-10-CM

## 2013-11-03 DIAGNOSIS — I5022 Chronic systolic (congestive) heart failure: Secondary | ICD-10-CM

## 2013-11-28 ENCOUNTER — Ambulatory Visit: Payer: Medicare Other | Admitting: Cardiovascular Disease

## 2013-12-30 ENCOUNTER — Encounter: Payer: Medicare Other | Admitting: Internal Medicine

## 2014-01-02 ENCOUNTER — Emergency Department: Payer: Self-pay | Admitting: Emergency Medicine

## 2014-01-02 LAB — CBC
HCT: 37 % — ABNORMAL LOW (ref 40.0–52.0)
HGB: 12.3 g/dL — ABNORMAL LOW (ref 13.0–18.0)
MCH: 31.7 pg (ref 26.0–34.0)
MCHC: 33.3 g/dL (ref 32.0–36.0)
MCV: 95 fL (ref 80–100)
Platelet: 92 10*3/uL — ABNORMAL LOW (ref 150–440)
RBC: 3.88 10*6/uL — AB (ref 4.40–5.90)
RDW: 14.6 % — AB (ref 11.5–14.5)
WBC: 5.1 10*3/uL (ref 3.8–10.6)

## 2014-01-02 LAB — COMPREHENSIVE METABOLIC PANEL
ANION GAP: 6 — AB (ref 7–16)
AST: 31 U/L (ref 15–37)
Albumin: 3.2 g/dL — ABNORMAL LOW (ref 3.4–5.0)
Alkaline Phosphatase: 55 U/L
BUN: 14 mg/dL (ref 7–18)
Bilirubin,Total: 0.6 mg/dL (ref 0.2–1.0)
CREATININE: 1.45 mg/dL — AB (ref 0.60–1.30)
Calcium, Total: 9.2 mg/dL (ref 8.5–10.1)
Chloride: 98 mmol/L (ref 98–107)
Co2: 29 mmol/L (ref 21–32)
EGFR (Non-African Amer.): 45 — ABNORMAL LOW
GFR CALC AF AMER: 52 — AB
GLUCOSE: 84 mg/dL (ref 65–99)
Osmolality: 266 (ref 275–301)
Potassium: 4.2 mmol/L (ref 3.5–5.1)
SGPT (ALT): 16 U/L (ref 12–78)
SODIUM: 133 mmol/L — AB (ref 136–145)
Total Protein: 7.1 g/dL (ref 6.4–8.2)

## 2014-01-02 LAB — TROPONIN I: Troponin-I: 0.02 ng/mL

## 2014-01-02 LAB — CK TOTAL AND CKMB (NOT AT ARMC)
CK, Total: 212 U/L (ref 35–232)
CK-MB: 6.1 ng/mL — ABNORMAL HIGH (ref 0.5–3.6)

## 2014-01-02 LAB — PRO B NATRIURETIC PEPTIDE: B-TYPE NATIURETIC PEPTID: 3524 pg/mL — AB (ref 0–450)

## 2014-01-07 LAB — CULTURE, BLOOD (SINGLE)

## 2014-01-13 ENCOUNTER — Encounter: Payer: Self-pay | Admitting: Internal Medicine

## 2014-01-13 ENCOUNTER — Other Ambulatory Visit: Payer: Self-pay | Admitting: Internal Medicine

## 2014-01-13 ENCOUNTER — Ambulatory Visit (INDEPENDENT_AMBULATORY_CARE_PROVIDER_SITE_OTHER): Payer: PRIVATE HEALTH INSURANCE | Admitting: Internal Medicine

## 2014-01-13 VITALS — BP 110/60 | HR 68 | Ht 70.5 in | Wt 194.0 lb

## 2014-01-13 DIAGNOSIS — Z95810 Presence of automatic (implantable) cardiac defibrillator: Secondary | ICD-10-CM

## 2014-01-13 DIAGNOSIS — I2589 Other forms of chronic ischemic heart disease: Secondary | ICD-10-CM

## 2014-01-13 DIAGNOSIS — I4729 Other ventricular tachycardia: Secondary | ICD-10-CM

## 2014-01-13 DIAGNOSIS — I509 Heart failure, unspecified: Secondary | ICD-10-CM

## 2014-01-13 DIAGNOSIS — I5022 Chronic systolic (congestive) heart failure: Secondary | ICD-10-CM

## 2014-01-13 DIAGNOSIS — I2510 Atherosclerotic heart disease of native coronary artery without angina pectoris: Secondary | ICD-10-CM

## 2014-01-13 DIAGNOSIS — I472 Ventricular tachycardia: Secondary | ICD-10-CM

## 2014-01-13 DIAGNOSIS — I255 Ischemic cardiomyopathy: Secondary | ICD-10-CM

## 2014-01-13 DIAGNOSIS — I4891 Unspecified atrial fibrillation: Secondary | ICD-10-CM

## 2014-01-13 LAB — MDC_IDC_ENUM_SESS_TYPE_INCLINIC
Brady Statistic AP VP Percent: 0.02 %
Brady Statistic AP VS Percent: 4.61 %
Brady Statistic AS VP Percent: 0.18 %
Brady Statistic AS VS Percent: 95.19 %
Brady Statistic RV Percent Paced: 0.2 %
Date Time Interrogation Session: 20150120104021
HIGH POWER IMPEDANCE MEASURED VALUE: 48 Ohm
HighPow Impedance: 38 Ohm
Lead Channel Impedance Value: 360 Ohm
Lead Channel Pacing Threshold Amplitude: 1 V
Lead Channel Pacing Threshold Amplitude: 1.5 V
Lead Channel Pacing Threshold Pulse Width: 0.4 ms
Lead Channel Sensing Intrinsic Amplitude: 19.3264
Lead Channel Setting Sensing Sensitivity: 0.3 mV
MDC IDC MSMT BATTERY VOLTAGE: 3.04 V
MDC IDC MSMT LEADCHNL RA IMPEDANCE VALUE: 528 Ohm
MDC IDC MSMT LEADCHNL RA SENSING INTR AMPL: 1.3823
MDC IDC MSMT LEADCHNL RV PACING THRESHOLD PULSEWIDTH: 0.6 ms
MDC IDC SET LEADCHNL RA PACING AMPLITUDE: 2 V
MDC IDC SET LEADCHNL RV PACING AMPLITUDE: 2.5 V
MDC IDC SET LEADCHNL RV PACING PULSEWIDTH: 0.6 ms
MDC IDC SET ZONE DETECTION INTERVAL: 320 ms
MDC IDC SET ZONE DETECTION INTERVAL: 350 ms
MDC IDC SET ZONE DETECTION INTERVAL: 450 ms
MDC IDC STAT BRADY RA PERCENT PACED: 4.63 %
Zone Setting Detection Interval: 280 ms
Zone Setting Detection Interval: 400 ms

## 2014-01-13 LAB — BASIC METABOLIC PANEL
ANION GAP: 1 — AB (ref 7–16)
BUN: 35 mg/dL — AB (ref 7–18)
CALCIUM: 9.2 mg/dL (ref 8.5–10.1)
CREATININE: 1.4 mg/dL — AB (ref 0.60–1.30)
Chloride: 99 mmol/L (ref 98–107)
Co2: 34 mmol/L — ABNORMAL HIGH (ref 21–32)
EGFR (African American): 54 — ABNORMAL LOW
GFR CALC NON AF AMER: 47 — AB
GLUCOSE: 110 mg/dL — AB (ref 65–99)
OSMOLALITY: 277 (ref 275–301)
Potassium: 4.1 mmol/L (ref 3.5–5.1)
Sodium: 134 mmol/L — ABNORMAL LOW (ref 136–145)

## 2014-01-13 LAB — TSH: Thyroid Stimulating Horm: 1.2 u[IU]/mL

## 2014-01-13 MED ORDER — AMIODARONE HCL 200 MG PO TABS: 200.0000 mg | ORAL_TABLET | Freq: Every day | ORAL | Status: DC

## 2014-01-13 NOTE — Assessment & Plan Note (Signed)
Stable. We'll continue on current medications

## 2014-01-13 NOTE — Assessment & Plan Note (Signed)
There has been no intercurrent ventricular tachycardia treated. The burden of nonsustained ventricular tachycardia as far decreased. We'll decrease his amiodarone from 400--200 mg a day and check his surveillance laboratories

## 2014-01-13 NOTE — Assessment & Plan Note (Signed)
No intercurrent Atrial fibrillation or flutter  

## 2014-01-13 NOTE — Assessment & Plan Note (Signed)
euvolemic 
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
## 2014-01-13 NOTE — Patient Instructions (Signed)
Your physician recommends that you schedule a follow-up appointment in: 3 months with Dr. Mariah MillingGollan  Your physician wants you to follow-up in: 6 months with Dr. Logan BoresKlein You will receive a reminder letter in the mail two months in advance. If you don't receive a letter, please call our office to schedule the follow-up appointment.  Your physician recommends that you have lab work today TSH  Basic Metabolic Panel   Your physician has recommended you make the following change in your medication:  Amiodarone 200 mg daily

## 2014-01-13 NOTE — Assessment & Plan Note (Signed)
The patient's device was interrogated.  The information was reviewed. No changes were made in the programming.    

## 2014-01-19 ENCOUNTER — Telehealth: Payer: Self-pay | Admitting: *Deleted

## 2014-01-19 DIAGNOSIS — I5022 Chronic systolic (congestive) heart failure: Secondary | ICD-10-CM

## 2014-01-19 NOTE — Telephone Encounter (Signed)
Reviewed results with patient.  Scheduled lab and result

## 2014-01-28 ENCOUNTER — Other Ambulatory Visit: Payer: Self-pay | Admitting: Cardiovascular Disease

## 2014-02-05 ENCOUNTER — Encounter: Payer: Self-pay | Admitting: Cardiovascular Disease

## 2014-02-05 ENCOUNTER — Ambulatory Visit (INDEPENDENT_AMBULATORY_CARE_PROVIDER_SITE_OTHER): Payer: PRIVATE HEALTH INSURANCE | Admitting: Cardiovascular Disease

## 2014-02-05 VITALS — BP 120/58 | HR 78 | Ht 70.5 in | Wt 194.5 lb

## 2014-02-05 DIAGNOSIS — I2510 Atherosclerotic heart disease of native coronary artery without angina pectoris: Secondary | ICD-10-CM

## 2014-02-05 DIAGNOSIS — I255 Ischemic cardiomyopathy: Secondary | ICD-10-CM

## 2014-02-05 DIAGNOSIS — I4891 Unspecified atrial fibrillation: Secondary | ICD-10-CM

## 2014-02-05 DIAGNOSIS — I2589 Other forms of chronic ischemic heart disease: Secondary | ICD-10-CM

## 2014-02-05 NOTE — Assessment & Plan Note (Signed)
Appears to be maintaining normal sinus rhythm on amiodarone and Coreg

## 2014-02-05 NOTE — Patient Instructions (Signed)
You are doing well. No medication changes were made.  Please take extra torsemide as needed for leg/ankle swelling, weight gain, shortness of breath  Please call us if you have new issues that need to be addressed before your next appt.  Your physician wants you to follow-up in: 3 months.  You will receive a reminder letter in the mail two months in advance. If you don't receive a letter, please call our office to schedule the follow-up appointment.

## 2014-02-05 NOTE — Assessment & Plan Note (Signed)
Currently with no symptoms of angina. Appears relatively euvolemic. No medication changes made

## 2014-02-05 NOTE — Progress Notes (Signed)
Patient ID: Kaynan Klonowski, male    DOB: 07/11/1930, 80 y.o.   MRN: [id number]  HPI Comments: 80 y.o. African-American male who recently moved to [facility] from [place] with a PMHx of chronic systolic/biventricular CHF, CAD, h/o atrial fibrillation, CKD (stage III), DM2 (newly diagnosed), AAA, HTN, asthma and BPH who was admitted to [facility] 7/16 to 07/15/13 for acute on chronic CHF after running out of his home diuretics.   Readmission to the hospital 10/02/2013 for similar symptoms of acute on chronic systolic CHF. BNP was 8340 CT scan of the chest showed coronary artery disease, cardiomegaly, small pleural effusions, COPD  In followup today, he reports that he is doing well. He take torsemide 20 mg daily. Mild pitting edema around his ankles. Also with some shortness of breath with exertion. He is not checking his weight on a daily basis. He does not know which of these pills is the diuretic. Overall has no new complaints apart from his severe knee pain. He is requesting pain medications. Wife reports that he recently received pain medication  Echocardiogram 07/10/2013 shows ejection fraction 20-25%, mild-to-moderate aortic regurg, moderate to severe MR severe TR Medication compliance was an issue, also drinking too much fluids he was treated with IV Lasix with improvement of his symptoms. He presents for followup today and reports that he is doing well. He does have a low-grade chronic cough..    in the past,He had frequent short runs of NSVT and amiodarone was started.   Previous stress test and heart cath recently which were "normal." Prior cardiologist "Dr. Fran Lowes in [place], [place]."    lab work July 2014 shows hemoglobin A1c 6.8, total cholesterol 106, LDL 49, creatinine 1.21. Most recent basic metabolic panel shows normal TSH, creatinine 1.4  EKG today shows normal sinus rhythm with rate 78 beats per minute, left anterior fascicular block, nonspecific ST and T wave abnormality   Outpatient  Encounter Prescriptions as of 02/05/2014  Medication Sig  . albuterol (PROVENTIL HFA;VENTOLIN HFA) 108 (90 BASE) MCG/ACT inhaler Inhale 2 puffs into the lungs every 4 (four) hours as needed for wheezing.  Marland Kitchen amiodarone (PACERONE) 200 MG tablet Take 1 tablet (200 mg total) by mouth daily.  Marland Kitchen aspirin 81 MG tablet Take 81 mg by mouth daily.  . brimonidine (ALPHAGAN) 0.15 % ophthalmic solution Place 1 drop into both eyes 2 (two) times daily.  . carisoprodol (SOMA) 350 MG tablet Take 350 mg by mouth as needed.   . carvedilol (COREG) 3.125 MG tablet Take 3.125 mg by mouth 2 (two) times daily with a meal.  . fluticasone-salmeterol (ADVAIR HFA) 115-21 MCG/ACT inhaler Inhale 2 puffs into the lungs 2 (two) times daily.  Marland Kitchen HYDROcodone-acetaminophen (NORCO) 7.5-325 MG per tablet Take 1 tablet by mouth every 6 (six) hours as needed for pain.  Marland Kitchen latanoprost (XALATAN) 0.005 % ophthalmic solution 1 drop at bedtime.  Marland Kitchen lisinopril (PRINIVIL,ZESTRIL) 2.5 MG tablet Take 2.5 mg by mouth daily.  Marland Kitchen NITROSTAT 0.4 MG SL tablet PLACE 1 TABLET (0.4 MG TOTAL) UNDER THE TONGUE EVERY 5 (FIVE) MINUTES AS NEEDED FOR CHEST PAIN.  . NON FORMULARY Oxygen 3 liters daily.  . potassium chloride (K-DUR) 10 MEQ tablet Take 1 tablet (10 mEq total) by mouth daily.  . predniSONE (STERAPRED UNI-PAK) 10 MG tablet Take 10 mg by mouth daily.   . tamsulosin (FLOMAX) 0.4 MG CAPS Take 0.4 mg by mouth daily.  Marland Kitchen tiotropium (SPIRIVA HANDIHALER) 18 MCG inhalation capsule Place 1 capsule (18 mcg total) into inhaler  and inhale daily.  Marland Kitchen. torsemide (DEMADEX) 20 MG tablet Take 1 tablet (20 mg total) by mouth 2 (two) times daily.  . traMADol (ULTRAM) 50 MG tablet Take 50 mg by mouth as needed.     Review of Systems  Constitutional: Negative.   HENT: Negative.   Eyes: Negative.   Cardiovascular: Positive for leg swelling.  Gastrointestinal: Negative.   Endocrine: Negative.   Musculoskeletal: Negative.   Skin: Negative.   Allergic/Immunologic:  Negative.   Neurological: Negative.   Hematological: Negative.   Psychiatric/Behavioral: Negative.   All other systems reviewed and are negative.   BP 120/58  Pulse 78  Ht 5' 10.5" (1.791 m)  Wt 194 lb 8 oz (88.225 kg)  BMI 27.50 kg/m2  Physical Exam  Nursing note and vitals reviewed. Constitutional: He is oriented to person, place, and time. He appears well-developed and well-nourished.  HENT:  Head: Normocephalic.  Nose: Nose normal.  Mouth/Throat: Oropharynx is clear and moist.  Eyes: Conjunctivae are normal. Pupils are equal, round, and reactive to light.  Neck: Normal range of motion. Neck supple. No JVD present.  Cardiovascular: Normal rate, regular rhythm, S1 normal, S2 normal and intact distal pulses.  Exam reveals no gallop and no friction rub.   Murmur heard.  Crescendo systolic murmur is present with a grade of 2/6  Pulmonary/Chest: Effort normal and breath sounds normal. No respiratory distress. He has no wheezes. He has no rales. He exhibits no tenderness.  Dullness at the bases bilaterally one third way up, rales  Abdominal: Soft. Bowel sounds are normal. He exhibits no distension. There is no tenderness.  Musculoskeletal: Normal range of motion. He exhibits no edema and no tenderness.  Lymphadenopathy:    He has no cervical adenopathy.  Neurological: He is alert and oriented to person, place, and time. Coordination normal.  Skin: Skin is warm and dry. No rash noted. No erythema.  Psychiatric: He has a normal mood and affect. His behavior is normal. Judgment and thought content normal.      Assessment and Plan

## 2014-02-05 NOTE — Assessment & Plan Note (Signed)
Currently with no symptoms of angina. No further workup at this time. Continue current medication regimen. 

## 2014-03-28 ENCOUNTER — Other Ambulatory Visit: Payer: Self-pay | Admitting: Cardiovascular Disease

## 2014-04-30 ENCOUNTER — Inpatient Hospital Stay: Payer: Self-pay | Admitting: Internal Medicine

## 2014-04-30 LAB — COMPREHENSIVE METABOLIC PANEL
AST: 109 U/L — AB (ref 15–37)
Albumin: 3 g/dL — ABNORMAL LOW (ref 3.4–5.0)
Alkaline Phosphatase: 69 U/L
Anion Gap: 10 (ref 7–16)
BUN: 39 mg/dL — ABNORMAL HIGH (ref 7–18)
Bilirubin,Total: 0.6 mg/dL (ref 0.2–1.0)
Calcium, Total: 9.7 mg/dL (ref 8.5–10.1)
Chloride: 95 mmol/L — ABNORMAL LOW (ref 98–107)
Co2: 27 mmol/L (ref 21–32)
Creatinine: 2.73 mg/dL — ABNORMAL HIGH (ref 0.60–1.30)
GFR CALC AF AMER: 24 — AB
GFR CALC NON AF AMER: 21 — AB
GLUCOSE: 189 mg/dL — AB (ref 65–99)
Osmolality: 279 (ref 275–301)
Potassium: 4.3 mmol/L (ref 3.5–5.1)
SGPT (ALT): 44 U/L (ref 12–78)
SODIUM: 132 mmol/L — AB (ref 136–145)
TOTAL PROTEIN: 7.5 g/dL (ref 6.4–8.2)

## 2014-04-30 LAB — CK-MB
CK-MB: 11.6 ng/mL — AB (ref 0.5–3.6)
CK-MB: 10.9 ng/mL — ABNORMAL HIGH (ref 0.5–3.6)

## 2014-04-30 LAB — CBC WITH DIFFERENTIAL/PLATELET
BASOS PCT: 0.3 %
Basophil #: 0 10*3/uL (ref 0.0–0.1)
Eosinophil #: 0 10*3/uL (ref 0.0–0.7)
Eosinophil %: 0.3 %
HCT: 38.4 % — AB (ref 40.0–52.0)
HGB: 12 g/dL — AB (ref 13.0–18.0)
LYMPHS ABS: 1.2 10*3/uL (ref 1.0–3.6)
LYMPHS PCT: 19.2 %
MCH: 30 pg (ref 26.0–34.0)
MCHC: 31.3 g/dL — AB (ref 32.0–36.0)
MCV: 96 fL (ref 80–100)
MONO ABS: 1.2 x10 3/mm — AB (ref 0.2–1.0)
Monocyte %: 19.2 %
NEUTROS ABS: 3.7 10*3/uL (ref 1.4–6.5)
Neutrophil %: 61 %
PLATELETS: 107 10*3/uL — AB (ref 150–440)
RBC: 4.01 10*6/uL — ABNORMAL LOW (ref 4.40–5.90)
RDW: 16.1 % — ABNORMAL HIGH (ref 11.5–14.5)
WBC: 6.1 10*3/uL (ref 3.8–10.6)

## 2014-04-30 LAB — CBC
HCT: 37.4 % — AB (ref 40.0–52.0)
HGB: 11.7 g/dL — ABNORMAL LOW (ref 13.0–18.0)
MCH: 29.9 pg (ref 26.0–34.0)
MCHC: 31.2 g/dL — ABNORMAL LOW (ref 32.0–36.0)
MCV: 96 fL (ref 80–100)
Platelet: 108 10*3/uL — ABNORMAL LOW (ref 150–440)
RBC: 3.9 10*6/uL — ABNORMAL LOW (ref 4.40–5.90)
RDW: 16.2 % — ABNORMAL HIGH (ref 11.5–14.5)
WBC: 5.7 10*3/uL (ref 3.8–10.6)

## 2014-04-30 LAB — PROTIME-INR
INR: 1.1
Prothrombin Time: 14.4 secs (ref 11.5–14.7)

## 2014-04-30 LAB — URINALYSIS, COMPLETE
BILIRUBIN, UR: NEGATIVE
Bacteria: NONE SEEN
GLUCOSE, UR: NEGATIVE mg/dL (ref 0–75)
Ketone: NEGATIVE
Leukocyte Esterase: NEGATIVE
NITRITE: NEGATIVE
PH: 5 (ref 4.5–8.0)
Protein: >=500
SPECIFIC GRAVITY: 1.024 (ref 1.003–1.030)
Squamous Epithelial: 1
WBC UR: 26 /HPF (ref 0–5)

## 2014-04-30 LAB — TROPONIN I
Troponin-I: 0.07 ng/mL — ABNORMAL HIGH
Troponin-I: 0.14 ng/mL — ABNORMAL HIGH
Troponin-I: 0.14 ng/mL — ABNORMAL HIGH

## 2014-04-30 LAB — PRO B NATRIURETIC PEPTIDE: B-TYPE NATIURETIC PEPTID: 20714 pg/mL — AB (ref 0–450)

## 2014-04-30 LAB — CK: CK, Total: 990 U/L — ABNORMAL HIGH

## 2014-04-30 LAB — APTT: Activated PTT: 31.4 secs (ref 23.6–35.9)

## 2014-05-01 ENCOUNTER — Ambulatory Visit: Payer: Self-pay | Admitting: Neurology

## 2014-05-01 DIAGNOSIS — N183 Chronic kidney disease, stage 3 unspecified: Secondary | ICD-10-CM

## 2014-05-01 DIAGNOSIS — I2510 Atherosclerotic heart disease of native coronary artery without angina pectoris: Secondary | ICD-10-CM

## 2014-05-01 DIAGNOSIS — I5023 Acute on chronic systolic (congestive) heart failure: Secondary | ICD-10-CM

## 2014-05-01 LAB — CBC WITH DIFFERENTIAL/PLATELET
BASOS ABS: 0 10*3/uL (ref 0.0–0.1)
BASOS ABS: 0 10*3/uL (ref 0.0–0.1)
BASOS PCT: 0.3 %
Basophil %: 0.2 %
EOS ABS: 0 10*3/uL (ref 0.0–0.7)
EOS PCT: 0.4 %
Eosinophil #: 0 10*3/uL (ref 0.0–0.7)
Eosinophil %: 0.5 %
HCT: 37.6 % — AB (ref 40.0–52.0)
HCT: 38.5 % — AB (ref 40.0–52.0)
HGB: 11.9 g/dL — AB (ref 13.0–18.0)
HGB: 12.2 g/dL — AB (ref 13.0–18.0)
LYMPHS ABS: 1.1 10*3/uL (ref 1.0–3.6)
LYMPHS ABS: 0.8 10*3/uL — AB (ref 1.0–3.6)
Lymphocyte %: 15.5 %
Lymphocyte %: 9.6 %
MCH: 29.9 pg (ref 26.0–34.0)
MCH: 30.4 pg (ref 26.0–34.0)
MCHC: 31.6 g/dL — AB (ref 32.0–36.0)
MCHC: 31.6 g/dL — ABNORMAL LOW (ref 32.0–36.0)
MCV: 96 fL (ref 80–100)
MCV: 95 fL (ref 80–100)
MONO ABS: 1.4 x10 3/mm — AB (ref 0.2–1.0)
MONO ABS: 1.1 x10 3/mm — AB (ref 0.2–1.0)
MONOS PCT: 14.2 %
Monocyte %: 19.3 %
NEUTROS ABS: 4.7 10*3/uL (ref 1.4–6.5)
NEUTROS PCT: 64.5 %
NEUTROS PCT: 75.5 %
Neutrophil #: 6 10*3/uL (ref 1.4–6.5)
PLATELETS: 99 10*3/uL — AB (ref 150–440)
Platelet: 122 10*3/uL — ABNORMAL LOW (ref 150–440)
RBC: 3.91 10*6/uL — ABNORMAL LOW (ref 4.40–5.90)
RBC: 4.07 10*6/uL — ABNORMAL LOW (ref 4.40–5.90)
RDW: 15.9 % — AB (ref 11.5–14.5)
RDW: 16 % — ABNORMAL HIGH (ref 11.5–14.5)
WBC: 7.3 10*3/uL (ref 3.8–10.6)
WBC: 8 10*3/uL (ref 3.8–10.6)

## 2014-05-01 LAB — COMPREHENSIVE METABOLIC PANEL
ALK PHOS: 78 U/L
Albumin: 2.7 g/dL — ABNORMAL LOW (ref 3.4–5.0)
Anion Gap: 9 (ref 7–16)
BILIRUBIN TOTAL: 0.4 mg/dL (ref 0.2–1.0)
BUN: 40 mg/dL — ABNORMAL HIGH (ref 7–18)
CALCIUM: 9.5 mg/dL (ref 8.5–10.1)
CHLORIDE: 96 mmol/L — AB (ref 98–107)
CO2: 29 mmol/L (ref 21–32)
CREATININE: 2.61 mg/dL — AB (ref 0.60–1.30)
EGFR (African American): 26 — ABNORMAL LOW
EGFR (Non-African Amer.): 22 — ABNORMAL LOW
Glucose: 92 mg/dL (ref 65–99)
OSMOLALITY: 278 (ref 275–301)
POTASSIUM: 4.3 mmol/L (ref 3.5–5.1)
SGOT(AST): 95 U/L — ABNORMAL HIGH (ref 15–37)
SGPT (ALT): 44 U/L (ref 12–78)
Sodium: 134 mmol/L — ABNORMAL LOW (ref 136–145)
TOTAL PROTEIN: 7.4 g/dL (ref 6.4–8.2)

## 2014-05-01 LAB — APTT
ACTIVATED PTT: 100.9 s — AB (ref 23.6–35.9)
Activated PTT: 32.9 secs (ref 23.6–35.9)

## 2014-05-01 LAB — CK-MB: CK-MB: 9.9 ng/mL — ABNORMAL HIGH (ref 0.5–3.6)

## 2014-05-01 LAB — TSH: THYROID STIMULATING HORM: 0.923 u[IU]/mL

## 2014-05-01 LAB — LIPID PANEL
Cholesterol: 93 mg/dL (ref 0–200)
HDL: 49 mg/dL (ref 40–60)
LDL CHOLESTEROL, CALC: 34 mg/dL (ref 0–100)
Triglycerides: 52 mg/dL (ref 0–200)
VLDL Cholesterol, Calc: 10 mg/dL (ref 5–40)

## 2014-05-01 LAB — HEMOGLOBIN A1C: HEMOGLOBIN A1C: 6.5 % — AB (ref 4.2–6.3)

## 2014-05-02 DIAGNOSIS — I359 Nonrheumatic aortic valve disorder, unspecified: Secondary | ICD-10-CM

## 2014-05-02 LAB — SYNOVIAL CELL COUNT + DIFF, W/ CRYSTALS
BASOS ABS: 0 %
EOS PCT: 0 %
LYMPHS PCT: 0 %
Neutrophils: 91 %
Nucleated Cell Count: 3254 /mm3
OTHER CELLS BF: 0 %
Other Mononuclear Cells: 9 %

## 2014-05-02 LAB — BASIC METABOLIC PANEL
Anion Gap: 7 (ref 7–16)
BUN: 44 mg/dL — ABNORMAL HIGH (ref 7–18)
Calcium, Total: 9.2 mg/dL (ref 8.5–10.1)
Chloride: 92 mmol/L — ABNORMAL LOW (ref 98–107)
Co2: 35 mmol/L — ABNORMAL HIGH (ref 21–32)
Creatinine: 2.64 mg/dL — ABNORMAL HIGH (ref 0.60–1.30)
EGFR (African American): 25 — ABNORMAL LOW
EGFR (Non-African Amer.): 22 — ABNORMAL LOW
Glucose: 108 mg/dL — ABNORMAL HIGH (ref 65–99)
Osmolality: 280 (ref 275–301)
Potassium: 3.8 mmol/L (ref 3.5–5.1)
Sodium: 134 mmol/L — ABNORMAL LOW (ref 136–145)

## 2014-05-02 LAB — APTT: ACTIVATED PTT: 48 s — AB (ref 23.6–35.9)

## 2014-05-02 LAB — URINE CULTURE

## 2014-05-02 LAB — MAGNESIUM: MAGNESIUM: 2.3 mg/dL

## 2014-05-03 LAB — BASIC METABOLIC PANEL
Anion Gap: 5 — ABNORMAL LOW (ref 7–16)
BUN: 36 mg/dL — AB (ref 7–18)
CO2: 38 mmol/L — AB (ref 21–32)
CREATININE: 2.03 mg/dL — AB (ref 0.60–1.30)
Calcium, Total: 9.7 mg/dL (ref 8.5–10.1)
Chloride: 89 mmol/L — ABNORMAL LOW (ref 98–107)
GFR CALC AF AMER: 35 — AB
GFR CALC NON AF AMER: 30 — AB
GLUCOSE: 152 mg/dL — AB (ref 65–99)
Osmolality: 276 (ref 275–301)
Potassium: 3.4 mmol/L — ABNORMAL LOW (ref 3.5–5.1)
SODIUM: 132 mmol/L — AB (ref 136–145)

## 2014-05-04 LAB — BASIC METABOLIC PANEL
ANION GAP: 2 — AB (ref 7–16)
BUN: 42 mg/dL — AB (ref 7–18)
CALCIUM: 9.6 mg/dL (ref 8.5–10.1)
Chloride: 92 mmol/L — ABNORMAL LOW (ref 98–107)
Co2: 39 mmol/L — ABNORMAL HIGH (ref 21–32)
Creatinine: 2.12 mg/dL — ABNORMAL HIGH (ref 0.60–1.30)
EGFR (African American): 33 — ABNORMAL LOW
EGFR (Non-African Amer.): 28 — ABNORMAL LOW
Glucose: 171 mg/dL — ABNORMAL HIGH (ref 65–99)
OSMOLALITY: 281 (ref 275–301)
POTASSIUM: 4.2 mmol/L (ref 3.5–5.1)
Sodium: 133 mmol/L — ABNORMAL LOW (ref 136–145)

## 2014-05-05 ENCOUNTER — Ambulatory Visit: Payer: PRIVATE HEALTH INSURANCE | Admitting: Cardiovascular Disease

## 2014-05-05 LAB — BASIC METABOLIC PANEL
Anion Gap: 3 — ABNORMAL LOW (ref 7–16)
BUN: 53 mg/dL — ABNORMAL HIGH (ref 7–18)
CHLORIDE: 90 mmol/L — AB (ref 98–107)
Calcium, Total: 9.3 mg/dL (ref 8.5–10.1)
Co2: 40 mmol/L (ref 21–32)
Creatinine: 2.17 mg/dL — ABNORMAL HIGH (ref 0.60–1.30)
EGFR (Non-African Amer.): 28 — ABNORMAL LOW
GFR CALC AF AMER: 32 — AB
Glucose: 206 mg/dL — ABNORMAL HIGH (ref 65–99)
Osmolality: 287 (ref 275–301)
Potassium: 4.4 mmol/L (ref 3.5–5.1)
Sodium: 133 mmol/L — ABNORMAL LOW (ref 136–145)

## 2014-05-05 LAB — PROTEIN / CREATININE RATIO, URINE
Creatinine, Urine: 113.9 mg/dL (ref 30.0–125.0)
Protein, Random Urine: 207 mg/dL — ABNORMAL HIGH (ref 0–12)
Protein/Creat. Ratio: 1817 mg/gCREAT — ABNORMAL HIGH (ref 0–200)

## 2014-05-05 LAB — CBC WITH DIFFERENTIAL/PLATELET
BASOS ABS: 0 10*3/uL (ref 0.0–0.1)
Basophil %: 0.1 %
EOS ABS: 0 10*3/uL (ref 0.0–0.7)
Eosinophil %: 0 %
HCT: 36.8 % — AB (ref 40.0–52.0)
HGB: 12.1 g/dL — AB (ref 13.0–18.0)
Lymphocyte #: 0.4 10*3/uL — ABNORMAL LOW (ref 1.0–3.6)
Lymphocyte %: 4 %
MCH: 31.1 pg (ref 26.0–34.0)
MCHC: 32.9 g/dL (ref 32.0–36.0)
MCV: 94 fL (ref 80–100)
MONO ABS: 0.8 x10 3/mm (ref 0.2–1.0)
Monocyte %: 7.7 %
Neutrophil #: 9.6 10*3/uL — ABNORMAL HIGH (ref 1.4–6.5)
Neutrophil %: 88.2 %
PLATELETS: 149 10*3/uL — AB (ref 150–440)
RBC: 3.89 10*6/uL — ABNORMAL LOW (ref 4.40–5.90)
RDW: 15.5 % — ABNORMAL HIGH (ref 11.5–14.5)
WBC: 10.9 10*3/uL — ABNORMAL HIGH (ref 3.8–10.6)

## 2014-05-05 LAB — OCCULT BLOOD X 1 CARD TO LAB, STOOL: Occult Blood, Feces: NEGATIVE

## 2014-05-05 LAB — CULTURE, BLOOD (SINGLE)

## 2014-05-05 LAB — URIC ACID: URIC ACID: 4 mg/dL (ref 3.5–7.2)

## 2014-05-06 LAB — BASIC METABOLIC PANEL
Anion Gap: 2 — ABNORMAL LOW (ref 7–16)
BUN: 52 mg/dL — ABNORMAL HIGH (ref 7–18)
CALCIUM: 9.5 mg/dL (ref 8.5–10.1)
CHLORIDE: 92 mmol/L — AB (ref 98–107)
CO2: 41 mmol/L — AB (ref 21–32)
Creatinine: 1.81 mg/dL — ABNORMAL HIGH (ref 0.60–1.30)
EGFR (African American): 40 — ABNORMAL LOW
EGFR (Non-African Amer.): 34 — ABNORMAL LOW
Glucose: 187 mg/dL — ABNORMAL HIGH (ref 65–99)
Osmolality: 289 (ref 275–301)
Potassium: 4.6 mmol/L (ref 3.5–5.1)
Sodium: 135 mmol/L — ABNORMAL LOW (ref 136–145)

## 2014-05-06 LAB — PROTEIN ELECTROPHORESIS(ARMC)

## 2014-05-06 LAB — UR PROT ELECTROPHORESIS, URINE RANDOM

## 2014-05-06 LAB — BODY FLUID CULTURE

## 2014-05-07 DIAGNOSIS — I5023 Acute on chronic systolic (congestive) heart failure: Secondary | ICD-10-CM

## 2014-05-08 LAB — POTASSIUM: Potassium: 4.9 mmol/L (ref 3.5–5.1)

## 2014-05-08 LAB — URINALYSIS, COMPLETE
BILIRUBIN, UR: NEGATIVE
Bacteria: NONE SEEN
Ketone: NEGATIVE
LEUKOCYTE ESTERASE: NEGATIVE
Nitrite: NEGATIVE
PH: 7 (ref 4.5–8.0)
PROTEIN: NEGATIVE
RBC,UR: <1 /HPF (ref 0–5)
Specific Gravity: 1.013 (ref 1.003–1.030)
Squamous Epithelial: NONE SEEN

## 2014-05-08 LAB — BASIC METABOLIC PANEL
ANION GAP: 1 — AB (ref 7–16)
BUN: 41 mg/dL — AB (ref 7–18)
CALCIUM: 8.9 mg/dL (ref 8.5–10.1)
CO2: 45 mmol/L — AB (ref 21–32)
Chloride: 88 mmol/L — ABNORMAL LOW (ref 98–107)
Creatinine: 1.49 mg/dL — ABNORMAL HIGH (ref 0.60–1.30)
EGFR (African American): 50 — ABNORMAL LOW
EGFR (Non-African Amer.): 43 — ABNORMAL LOW
GLUCOSE: 126 mg/dL — AB (ref 65–99)
OSMOLALITY: 280 (ref 275–301)
POTASSIUM: 5.3 mmol/L — AB (ref 3.5–5.1)
SODIUM: 134 mmol/L — AB (ref 136–145)

## 2014-05-09 LAB — BASIC METABOLIC PANEL
Anion Gap: 2 — ABNORMAL LOW (ref 7–16)
BUN: 43 mg/dL — ABNORMAL HIGH (ref 7–18)
Calcium, Total: 9 mg/dL (ref 8.5–10.1)
Chloride: 87 mmol/L — ABNORMAL LOW (ref 98–107)
Co2: 45 mmol/L (ref 21–32)
Creatinine: 1.6 mg/dL — ABNORMAL HIGH (ref 0.60–1.30)
GFR CALC AF AMER: 46 — AB
GFR CALC NON AF AMER: 40 — AB
Glucose: 123 mg/dL — ABNORMAL HIGH (ref 65–99)
Osmolality: 280 (ref 275–301)
POTASSIUM: 5.2 mmol/L — AB (ref 3.5–5.1)
SODIUM: 134 mmol/L — AB (ref 136–145)

## 2014-05-09 LAB — HEMOGLOBIN: HGB: 12.7 g/dL — AB (ref 13.0–18.0)

## 2014-05-10 LAB — BASIC METABOLIC PANEL
ANION GAP: 5 — AB (ref 7–16)
BUN: 38 mg/dL — ABNORMAL HIGH (ref 7–18)
Calcium, Total: 8.8 mg/dL (ref 8.5–10.1)
Chloride: 86 mmol/L — ABNORMAL LOW (ref 98–107)
Co2: 44 mmol/L (ref 21–32)
Creatinine: 1.47 mg/dL — ABNORMAL HIGH (ref 0.60–1.30)
GFR CALC AF AMER: 51 — AB
GFR CALC NON AF AMER: 44 — AB
Glucose: 130 mg/dL — ABNORMAL HIGH (ref 65–99)
OSMOLALITY: 281 (ref 275–301)
POTASSIUM: 3.6 mmol/L (ref 3.5–5.1)
Sodium: 135 mmol/L — ABNORMAL LOW (ref 136–145)

## 2014-05-11 LAB — BASIC METABOLIC PANEL
Anion Gap: 1 — ABNORMAL LOW (ref 7–16)
BUN: 40 mg/dL — ABNORMAL HIGH (ref 7–18)
CHLORIDE: 91 mmol/L — AB (ref 98–107)
CO2: 42 mmol/L — AB (ref 21–32)
CREATININE: 1.29 mg/dL (ref 0.60–1.30)
Calcium, Total: 9 mg/dL (ref 8.5–10.1)
EGFR (Non-African Amer.): 52 — ABNORMAL LOW
GFR CALC AF AMER: 60 — AB
GLUCOSE: 116 mg/dL — AB (ref 65–99)
Osmolality: 279 (ref 275–301)
Potassium: 3.6 mmol/L (ref 3.5–5.1)
SODIUM: 134 mmol/L — AB (ref 136–145)

## 2014-05-12 LAB — BASIC METABOLIC PANEL
ANION GAP: 2 — AB (ref 7–16)
BUN: 39 mg/dL — ABNORMAL HIGH (ref 7–18)
CALCIUM: 8.9 mg/dL (ref 8.5–10.1)
CREATININE: 1.46 mg/dL — AB (ref 0.60–1.30)
Chloride: 91 mmol/L — ABNORMAL LOW (ref 98–107)
Co2: 39 mmol/L — ABNORMAL HIGH (ref 21–32)
EGFR (Non-African Amer.): 44 — ABNORMAL LOW
GFR CALC AF AMER: 52 — AB
GLUCOSE: 256 mg/dL — AB (ref 65–99)
Osmolality: 283 (ref 275–301)
Potassium: 3.8 mmol/L (ref 3.5–5.1)
Sodium: 132 mmol/L — ABNORMAL LOW (ref 136–145)

## 2014-05-12 LAB — CBC WITH DIFFERENTIAL/PLATELET
Basophil #: 0 10*3/uL (ref 0.0–0.1)
Basophil %: 0.1 %
Eosinophil #: 0 10*3/uL (ref 0.0–0.7)
Eosinophil %: 0 %
HCT: 35.8 % — AB (ref 40.0–52.0)
HGB: 11.6 g/dL — ABNORMAL LOW (ref 13.0–18.0)
LYMPHS ABS: 0.4 10*3/uL — AB (ref 1.0–3.6)
Lymphocyte %: 3.8 %
MCH: 30.4 pg (ref 26.0–34.0)
MCHC: 32.4 g/dL (ref 32.0–36.0)
MCV: 94 fL (ref 80–100)
Monocyte #: 0.1 x10 3/mm — ABNORMAL LOW (ref 0.2–1.0)
Monocyte %: 1.3 %
NEUTROS ABS: 10.5 10*3/uL — AB (ref 1.4–6.5)
NEUTROS PCT: 94.8 %
Platelet: 159 10*3/uL (ref 150–440)
RBC: 3.82 10*6/uL — ABNORMAL LOW (ref 4.40–5.90)
RDW: 15.8 % — ABNORMAL HIGH (ref 11.5–14.5)
WBC: 11 10*3/uL — ABNORMAL HIGH (ref 3.8–10.6)

## 2014-05-13 LAB — PROTEIN ELECTROPHORESIS(ARMC)

## 2014-05-13 LAB — KAPPA/LAMBDA FREE LIGHT CHAINS (ARMC)

## 2014-05-14 LAB — UR PROT ELECTROPHORESIS, URINE RANDOM

## 2014-06-04 ENCOUNTER — Encounter: Payer: Self-pay | Admitting: *Deleted

## 2014-06-12 ENCOUNTER — Ambulatory Visit (INDEPENDENT_AMBULATORY_CARE_PROVIDER_SITE_OTHER): Payer: PRIVATE HEALTH INSURANCE | Admitting: Cardiovascular Disease

## 2014-06-12 ENCOUNTER — Encounter: Payer: Self-pay | Admitting: Cardiovascular Disease

## 2014-06-12 VITALS — BP 100/60 | HR 67 | Ht 71.0 in | Wt 282.0 lb

## 2014-06-12 DIAGNOSIS — I4891 Unspecified atrial fibrillation: Secondary | ICD-10-CM

## 2014-06-12 DIAGNOSIS — I509 Heart failure, unspecified: Secondary | ICD-10-CM

## 2014-06-12 DIAGNOSIS — I2510 Atherosclerotic heart disease of native coronary artery without angina pectoris: Secondary | ICD-10-CM

## 2014-06-12 DIAGNOSIS — J449 Chronic obstructive pulmonary disease, unspecified: Secondary | ICD-10-CM

## 2014-06-12 DIAGNOSIS — I255 Ischemic cardiomyopathy: Secondary | ICD-10-CM

## 2014-06-12 DIAGNOSIS — I5022 Chronic systolic (congestive) heart failure: Secondary | ICD-10-CM

## 2014-06-12 DIAGNOSIS — I25118 Atherosclerotic heart disease of native coronary artery with other forms of angina pectoris: Secondary | ICD-10-CM

## 2014-06-12 DIAGNOSIS — I2589 Other forms of chronic ischemic heart disease: Secondary | ICD-10-CM

## 2014-06-12 DIAGNOSIS — I209 Angina pectoris, unspecified: Secondary | ICD-10-CM

## 2014-06-12 NOTE — Progress Notes (Signed)
Patient ID: [name], male    DOB: 1930/12/21, 80 y.o.   MRN: [id number]  HPI Comments: 80 y.o. African-American male [place] [facility] [place] with a PMHx of chronic systolic/biventricular CHF, CAD, h/o atrial fibrillation, CKD (stage III), DM2 (newly diagnosed), AAA, HTN, asthma and BPH who was admitted to [facility] 7/16 to 07/15/13 for acute on chronic CHF after running out of his home diuretics.   Readmission to the hospital 10/02/2013 for similar symptoms of acute on chronic systolic CHF. BNP was 8340 CT scan of the chest showed coronary artery disease, cardiomegaly, small pleural effusions, COPD  Admitted to the hospital 04/30/2012 with discharge May 12 2014 with COPD exacerbation, pneumonia requiring antibiotics. Is also felt to have a acute stroke with right-sided weakness of the arm and leg. He has polyarticular inflammatory non-gouty arthritis and during his hospital course, had steroid injection in his knee as well as wrist  In followup today, he continues to recover from his stroke. He is at [facility]. He was started on anticoagulation, eliquis for possible atrial fibrillation though none was documented during his hospital course. He denies any significant shortness of breath, weight change or lower extremity edema.  Ultrasound of the hospital 05/02/2014 showing no hemodynamic significant stenoses, mild to moderate bilateral plaquing  Echocardiogram 05/02/2014 showing ejection fraction 20-25%, normal right ventricular systolic pressures, moderately dilated left and right atrium  In followup today, he reports that he is doing well. He take torsemide 20 mg daily. Mild pitting edema around his ankles. Also with some shortness of breath with exertion. He is not checking his weight on a daily basis. He does not know which of these pills is the diuretic. Overall has no new complaints apart from his severe knee pain. He is requesting pain medications.  Wife reports that he recently received pain medication  Echocardiogram 07/10/2013 shows ejection fraction 20-25%, mild-to-moderate aortic regurg, moderate to severe MR severe TR Medication compliance was an issue, also drinking too much fluids he was treated with IV Lasix with improvement of his symptoms. He presents for followup today and reports that he is doing well. He does have a low-grade chronic cough..    in the past,He had frequent short runs of NSVT and amiodarone was started.   Previous stress test and heart cath recently which were "normal." Prior cardiologist [place]. [place]    lab work July 2014 shows hemoglobin A1c 6.8, total cholesterol 106, LDL 49, creatinine 1.21. Most recent basic metabolic panel shows normal TSH, creatinine 1.4  EKG today shows normal sinus rhythm with rate 67 beats per minute, left anterior fascicular block, nonspecific ST and T wave abnormality   Outpatient Encounter Prescriptions as of 06/12/2014  Medication Sig  . albuterol (PROVENTIL HFA;VENTOLIN HFA) 108 (90 BASE) MCG/ACT inhaler Inhale 2 puffs into the lungs every 4 (four) hours as needed for wheezing.  Marland Kitchen. amiodarone (PACERONE) 200 MG tablet Take 1 tablet (200 mg total) by mouth daily.  Marland Kitchen. apixaban (ELIQUIS) 5 MG TABS tablet Take 5 mg by mouth 2 (two) times daily.  Marland Kitchen. aspirin 81 MG tablet Take 81 mg by mouth daily.  . brimonidine (ALPHAGAN) 0.15 % ophthalmic solution Place 1 drop into both eyes 2 (two) times daily.  . carvedilol (COREG) 3.125 MG tablet Take 3.125 mg by mouth 2 (two) times daily with a meal.  . docusate sodium (COLACE) 100 MG capsule Take 100 mg by mouth 2 (two) times daily.  . fluticasone-salmeterol (ADVAIR  HFA) 115-21 MCG/ACT inhaler Inhale 2 puffs into the lungs 2 (two) times daily.  . furosemide (LASIX) 20 MG tablet Take 20 mg by mouth daily.  Marland Kitchen. HYDROcodone-acetaminophen (NORCO) 7.5-325 MG per tablet Take 1 tablet by mouth every 6 (six) hours as needed for moderate  pain.  Marland Kitchen. latanoprost (XALATAN) 0.005 % ophthalmic solution 1 drop at bedtime.  . NON FORMULARY Oxygen 3 liters daily.  . tamsulosin (FLOMAX) 0.4 MG CAPS Take 0.4 mg by mouth daily.  Marland Kitchen. tiotropium (SPIRIVA HANDIHALER) 18 MCG inhalation capsule Place 1 capsule (18 mcg total) into inhaler and inhale daily.  Marland Kitchen. torsemide (DEMADEX) 20 MG tablet Take 1 tablet (20 mg total) by mouth 2 (two) times daily.  . carisoprodol (SOMA) 350 MG tablet Take 350 mg by mouth as needed.   . CRESTOR 40 MG tablet Take 40 mg by mouth daily.   Marland Kitchen. KLOR-CON M10 10 MEQ tablet TAKE 1 TABLET (10 MEQ TOTAL) BY MOUTH DAILY.  Marland Kitchen. lisinopril (PRINIVIL,ZESTRIL) 2.5 MG tablet Take 2.5 mg by mouth daily.  Marland Kitchen. NITROSTAT 0.4 MG SL tablet PLACE 1 TABLET (0.4 MG TOTAL) UNDER THE TONGUE EVERY 5 (FIVE) MINUTES AS NEEDED FOR CHEST PAIN.  Marland Kitchen. predniSONE (STERAPRED UNI-PAK) 10 MG tablet Take 10 mg by mouth daily.   . saxagliptin HCl (ONGLYZA) 5 MG TABS tablet Take 5 mg by mouth daily.  . traMADol (ULTRAM) 50 MG tablet Take 50 mg by mouth as needed.   . [DISCONTINUED] clopidogrel (PLAVIX) 75 MG tablet Take 75 mg by mouth daily with breakfast.    Review of Systems  Constitutional: Negative.   HENT: Negative.   Eyes: Negative.   Respiratory: Positive for cough and shortness of breath.   Cardiovascular: Negative.   Gastrointestinal: Negative.   Endocrine: Negative.   Musculoskeletal: Negative.   Skin: Negative.   Allergic/Immunologic: Negative.   Neurological: Negative.   Hematological: Negative.   Psychiatric/Behavioral: Negative.   All other systems reviewed and are negative.  BP 100/60  Pulse 67  Ht 5\' 11"  (1.803 m)  Wt 282 lb (127.914 kg)  BMI 39.35 kg/m2  Physical Exam  Nursing note and vitals reviewed. Constitutional: He is oriented to person, place, and time. He appears well-developed and well-nourished.  HENT:  Head: Normocephalic.  Nose: Nose normal.  Mouth/Throat: Oropharynx is clear and moist.  Eyes: Conjunctivae are  normal. Pupils are equal, round, and reactive to light.  Neck: Normal range of motion. Neck supple. No JVD present.  Cardiovascular: Normal rate, regular rhythm, S1 normal, S2 normal and intact distal pulses.  Exam reveals no gallop and no friction rub.   Murmur heard.  Crescendo systolic murmur is present with a grade of 2/6  Pulmonary/Chest: Effort normal and breath sounds normal. No respiratory distress. He has no wheezes. He has no rales. He exhibits no tenderness.  Dullness at the bases bilaterally one third way up, rales  Abdominal: Soft. Bowel sounds are normal. He exhibits no distension. There is no tenderness.  Musculoskeletal: Normal range of motion. He exhibits no edema and no tenderness.  Lymphadenopathy:    He has no cervical adenopathy.  Neurological: He is alert and oriented to person, place, and time. Coordination normal.  Skin: Skin is warm and dry. No rash noted. No erythema.  Psychiatric: He has a normal mood and affect. His behavior is normal. Judgment and thought content normal.      Assessment and Plan

## 2014-06-12 NOTE — Assessment & Plan Note (Signed)
ICD in place. Ischemic cardiomyopathy. Euvolemic on today's visit

## 2014-06-12 NOTE — Assessment & Plan Note (Signed)
Currently with no symptoms of angina. No further workup at this time. Continue current medication regimen. 

## 2014-06-12 NOTE — Patient Instructions (Signed)
You are doing well. No medication changes were made.  Please call us if you have new issues that need to be addressed before your next appt.  Your physician wants you to follow-up in: 3 months You will receive a reminder letter in the mail two months in advance. If you don't receive a letter, please call our office to schedule the follow-up appointment.   

## 2014-06-12 NOTE — Assessment & Plan Note (Signed)
Recent admission to the hospital in May 2015 for COPD exacerbation requiring antibiotics

## 2014-06-12 NOTE — Assessment & Plan Note (Signed)
He is currently on Lasix 20 mg daily. Previously on torsemide. Most recent renal function in the hospital may 2015 he appeared hypovolemic. We'll continue on Lasix 20 mg daily for now, extra Lasix for leg edema or weight gain

## 2014-06-12 NOTE — Assessment & Plan Note (Signed)
He remains in normal sinus rhythm. Recently started on anticoagulation while in the hospital with his recent stroke. Tolerating anticoagulation. He is a high fall risk given right leg weakness.

## 2014-07-22 ENCOUNTER — Other Ambulatory Visit: Payer: Self-pay

## 2014-07-22 DIAGNOSIS — I5022 Chronic systolic (congestive) heart failure: Secondary | ICD-10-CM

## 2014-07-22 MED ORDER — CARVEDILOL 3.125 MG PO TABS: 3.1250 mg | ORAL_TABLET | Freq: Two times a day (BID) | ORAL | Status: AC

## 2014-07-22 MED ORDER — FUROSEMIDE 20 MG PO TABS: 20.0000 mg | ORAL_TABLET | Freq: Every day | ORAL | Status: DC

## 2014-07-22 MED ORDER — APIXABAN 5 MG PO TABS: 5.0000 mg | ORAL_TABLET | Freq: Two times a day (BID) | ORAL | Status: AC

## 2014-07-22 MED ORDER — AMIODARONE HCL 200 MG PO TABS: 200.0000 mg | ORAL_TABLET | Freq: Every day | ORAL | Status: DC

## 2014-07-22 NOTE — Telephone Encounter (Signed)
Nurse with [name],   This encounter was created in error - please disregard.

## 2014-07-25 ENCOUNTER — Emergency Department: Payer: Self-pay | Admitting: Emergency Medicine

## 2014-07-25 LAB — COMPREHENSIVE METABOLIC PANEL
ALBUMIN: 3.4 g/dL (ref 3.4–5.0)
ANION GAP: 6 — AB (ref 7–16)
Alkaline Phosphatase: 51 U/L
BILIRUBIN TOTAL: 0.6 mg/dL (ref 0.2–1.0)
BUN: 21 mg/dL — ABNORMAL HIGH (ref 7–18)
Calcium, Total: 9.5 mg/dL (ref 8.5–10.1)
Chloride: 100 mmol/L (ref 98–107)
Co2: 27 mmol/L (ref 21–32)
Creatinine: 1.47 mg/dL — ABNORMAL HIGH (ref 0.60–1.30)
EGFR (African American): 51 — ABNORMAL LOW
GFR CALC NON AF AMER: 44 — AB
GLUCOSE: 153 mg/dL — AB (ref 65–99)
OSMOLALITY: 272 (ref 275–301)
Potassium: 3.1 mmol/L — ABNORMAL LOW (ref 3.5–5.1)
SGOT(AST): 52 U/L — ABNORMAL HIGH (ref 15–37)
SGPT (ALT): 35 U/L
SODIUM: 133 mmol/L — AB (ref 136–145)
TOTAL PROTEIN: 7.5 g/dL (ref 6.4–8.2)

## 2014-07-25 LAB — CBC WITH DIFFERENTIAL/PLATELET
BASOS ABS: 0 10*3/uL (ref 0.0–0.1)
Basophil %: 0.3 %
EOS PCT: 0.2 %
Eosinophil #: 0 10*3/uL (ref 0.0–0.7)
HCT: 38.5 % — ABNORMAL LOW (ref 40.0–52.0)
HGB: 12.7 g/dL — AB (ref 13.0–18.0)
LYMPHS ABS: 0.7 10*3/uL — AB (ref 1.0–3.6)
Lymphocyte %: 10 %
MCH: 31.4 pg (ref 26.0–34.0)
MCHC: 33.1 g/dL (ref 32.0–36.0)
MCV: 95 fL (ref 80–100)
MONO ABS: 1.2 x10 3/mm — AB (ref 0.2–1.0)
Monocyte %: 16.4 %
Neutrophil #: 5.5 10*3/uL (ref 1.4–6.5)
Neutrophil %: 73.1 %
PLATELETS: 114 10*3/uL — AB (ref 150–440)
RBC: 4.06 10*6/uL — ABNORMAL LOW (ref 4.40–5.90)
RDW: 17.8 % — ABNORMAL HIGH (ref 11.5–14.5)
WBC: 7.5 10*3/uL (ref 3.8–10.6)

## 2014-07-25 LAB — TROPONIN I: Troponin-I: 0.02 ng/mL

## 2014-07-26 LAB — URINALYSIS, COMPLETE
BILIRUBIN, UR: NEGATIVE
Bacteria: NONE SEEN
GLUCOSE, UR: NEGATIVE mg/dL (ref 0–75)
Ketone: NEGATIVE
LEUKOCYTE ESTERASE: NEGATIVE
Nitrite: NEGATIVE
Ph: 6 (ref 4.5–8.0)
Protein: 30
RBC,UR: NONE SEEN /HPF (ref 0–5)
Specific Gravity: 1.024 (ref 1.003–1.030)
Squamous Epithelial: <1

## 2014-07-28 ENCOUNTER — Ambulatory Visit: Payer: Self-pay | Admitting: Podiatry

## 2014-08-04 ENCOUNTER — Encounter: Payer: Self-pay | Admitting: *Deleted

## 2014-09-18 ENCOUNTER — Encounter: Payer: Self-pay | Admitting: Cardiovascular Disease

## 2014-09-18 ENCOUNTER — Ambulatory Visit (INDEPENDENT_AMBULATORY_CARE_PROVIDER_SITE_OTHER): Payer: PRIVATE HEALTH INSURANCE | Admitting: Cardiovascular Disease

## 2014-09-18 VITALS — BP 93/59 | HR 60 | Ht 71.0 in | Wt 171.0 lb

## 2014-09-18 DIAGNOSIS — I4729 Other ventricular tachycardia: Secondary | ICD-10-CM

## 2014-09-18 DIAGNOSIS — Z95810 Presence of automatic (implantable) cardiac defibrillator: Secondary | ICD-10-CM

## 2014-09-18 DIAGNOSIS — I4891 Unspecified atrial fibrillation: Secondary | ICD-10-CM

## 2014-09-18 DIAGNOSIS — I5022 Chronic systolic (congestive) heart failure: Secondary | ICD-10-CM

## 2014-09-18 DIAGNOSIS — I25118 Atherosclerotic heart disease of native coronary artery with other forms of angina pectoris: Secondary | ICD-10-CM

## 2014-09-18 DIAGNOSIS — I509 Heart failure, unspecified: Secondary | ICD-10-CM

## 2014-09-18 DIAGNOSIS — J4489 Other specified chronic obstructive pulmonary disease: Secondary | ICD-10-CM

## 2014-09-18 DIAGNOSIS — J449 Chronic obstructive pulmonary disease, unspecified: Secondary | ICD-10-CM

## 2014-09-18 DIAGNOSIS — R079 Chest pain, unspecified: Secondary | ICD-10-CM

## 2014-09-18 DIAGNOSIS — I2510 Atherosclerotic heart disease of native coronary artery without angina pectoris: Secondary | ICD-10-CM

## 2014-09-18 DIAGNOSIS — I209 Angina pectoris, unspecified: Secondary | ICD-10-CM

## 2014-09-18 DIAGNOSIS — I472 Ventricular tachycardia, unspecified: Secondary | ICD-10-CM

## 2014-09-18 NOTE — Assessment & Plan Note (Addendum)
He appears relatively euvolemic. We will check a basic metabolic panel given his rapid weight loss since the beginning of the year. If renal function suggest prerenal state, we would decrease the Lasix down to daily dosing

## 2014-09-18 NOTE — Assessment & Plan Note (Addendum)
Followed by Dr. Klein 

## 2014-09-18 NOTE — Assessment & Plan Note (Signed)
Currently on amiodarone and carvedilol. Relatively asymptomatic

## 2014-09-18 NOTE — Patient Instructions (Signed)
You are doing well.  Please decrease the amiodarone down to one a day  We will try to get some labs today, a BMP  Please call us if you have new issues that need to be addressed before your next appt.  Your physician wants you to follow-up in: 6 months.  You will receive a reminder letter in the mail two months in advance. If you don't receive a letter, please call our office to schedule the follow-up appointment.

## 2014-09-18 NOTE — Progress Notes (Signed)
Patient ID: [name], male    DOB: 04/06/1930, 80 y.o.   MRN: [id number]  HPI Comments: 80 y.o. African-American male with a PMHx of chronic systolic/biventricular CHF, CAD, h/o atrial fibrillation, CKD (stage III), DM2, AAA, HTN, asthma and BPH who was admitted to [facility] 7/16 to 07/15/13 for acute on chronic CHF after running out of his home diuretics.  Previous stroke.  on anticoagulation, eliquis for possible atrial fibrillation though none was documented during his hospital course.  He presents for routine followup He currently lives at [facility], presents in a wheelchair on oxygen  In general he states that he is doing well. His weight appears to be slowly dropping over the past year. In February 2015 weight was 194 pounds, 182 pounds in June 2015, now 171 pounds. The recent blood work available. He denies any leg edema, no shortness of breath. In fact he reports his breathing is very good.   Readmission to the hospital 10/02/2013 for similar symptoms of acute on chronic systolic CHF. BNP was 8340 CT scan of the chest showed coronary artery disease, cardiomegaly, small pleural effusions, COPD  Admitted to the hospital 04/30/2012 with discharge May 12 2014 with COPD exacerbation, pneumonia requiring antibiotics. Is also felt to have a acute stroke with right-sided weakness of the arm and leg. He has polyarticular inflammatory non-gouty arthritis and during his hospital course, had steroid injection in his knee as well as wrist  Ultrasound of the hospital 05/02/2014 showing no hemodynamic significant stenoses, mild to moderate bilateral plaquing Echocardiogram 05/02/2014 showing ejection fraction 20-25%, normal right ventricular systolic pressures, moderately dilated left and right atrium  Echocardiogram 07/10/2013 shows ejection fraction 20-25%, mild-to-moderate aortic regurg, moderate to severe MR severe TR Medication compliance was an issue, also drinking too much fluids he was  treated with IV Lasix with improvement of his symptoms.   in the past,He had frequent short runs of NSVT and amiodarone was started.   Previous stress test and heart cath recently which were "normal." Prior cardiologist "Dr. Fran Lowes in [place], [place]."   EKG shows paced rhythm, rate 60 beats per minute, left anterior fascicular block, intraventricular conduction delay   Outpatient Encounter Prescriptions as of 09/18/2014  Medication Sig  . acetaZOLAMIDE (DIAMOX) 250 MG tablet Take 250 mg by mouth 2 (two) times daily.  Marland Kitchen albuterol (PROVENTIL HFA;VENTOLIN HFA) 108 (90 BASE) MCG/ACT inhaler Inhale 2 puffs into the lungs every 4 (four) hours as needed for wheezing.  . Amino Acids-Protein Hydrolys (FEEDING SUPPLEMENT, PRO-STAT SUGAR FREE 64,) LIQD Take 30 mLs by mouth 2 (two) times daily.  Marland Kitchen amiodarone (PACERONE) 200 MG tablet Take 200 mg by mouth every 12 (twelve) hours.  Marland Kitchen apixaban (ELIQUIS) 5 MG TABS tablet Take 1 tablet (5 mg total) by mouth 2 (two) times daily.  Marland Kitchen aspirin 81 MG tablet Take 81 mg by mouth daily.  . brimonidine (ALPHAGAN) 0.15 % ophthalmic solution Place 1 drop into both eyes 2 (two) times daily.  . carvedilol (COREG) 3.125 MG tablet Take 1 tablet (3.125 mg total) by mouth 2 (two) times daily with a meal.  . docusate sodium (COLACE) 100 MG capsule Take 100 mg by mouth 2 (two) times daily.  . fluticasone-salmeterol (ADVAIR HFA) 115-21 MCG/ACT inhaler Inhale 2 puffs into the lungs 2 (two) times daily.  . furosemide (LASIX) 40 MG tablet Take 40 mg by mouth 2 (two) times daily.  Marland Kitchen HYDROcodone-acetaminophen (NORCO) 7.5-325 MG per tablet Take 1 tablet by mouth every 6 (six) hours as  needed for moderate pain.  Marland Kitchen latanoprost (XALATAN) 0.005 % ophthalmic solution 1 drop at bedtime.  . NON FORMULARY Oxygen 3 liters daily.  . tamsulosin (FLOMAX) 0.4 MG CAPS Take 0.4 mg by mouth daily.  Marland Kitchen tiotropium (SPIRIVA HANDIHALER) 18 MCG inhalation capsule Place 1 capsule (18 mcg total) into inhaler and  inhale daily.    Review of Systems  Constitutional: Negative.   HENT: Negative.   Eyes: Negative.   Respiratory: Negative.   Cardiovascular: Negative.   Gastrointestinal: Negative.   Endocrine: Negative.   Musculoskeletal: Negative.   Skin: Negative.   Allergic/Immunologic: Negative.   Neurological: Negative.   Hematological: Negative.   Psychiatric/Behavioral: Negative.   All other systems reviewed and are negative.  Wt 171 lb (77.565 kg), blood pressure 93/59, heart rate 60 beats per minute  Physical Exam  Nursing note and vitals reviewed. Constitutional: He is oriented to person, place, and time. He appears well-developed and well-nourished.  Presenting in a wheelchair, on 2 L oxygen  HENT:  Head: Normocephalic.  Nose: Nose normal.  Mouth/Throat: Oropharynx is clear and moist.  Eyes: Conjunctivae are normal. Pupils are equal, round, and reactive to light.  Neck: Normal range of motion. Neck supple. No JVD present.  Cardiovascular: Normal rate, regular rhythm, S1 normal, S2 normal and intact distal pulses.  Exam reveals no gallop and no friction rub.   Murmur heard.  Crescendo systolic murmur is present with a grade of 2/6  Pulmonary/Chest: Effort normal. No respiratory distress. He has decreased breath sounds. He has no wheezes. He has no rales. He exhibits no tenderness.  Dullness at the bases bilaterally one third way up, rales  Abdominal: Soft. Bowel sounds are normal. He exhibits no distension. There is no tenderness.  Musculoskeletal: Normal range of motion. He exhibits no edema and no tenderness.  Lymphadenopathy:    He has no cervical adenopathy.  Neurological: He is alert and oriented to person, place, and time. Coordination normal.  Skin: Skin is warm and dry. No rash noted. No erythema.  Psychiatric: He has a normal mood and affect. His behavior is normal. Judgment and thought content normal.      Assessment and Plan

## 2014-09-18 NOTE — Assessment & Plan Note (Signed)
Currently on anticoagulation. No recurrent stroke

## 2014-09-18 NOTE — Assessment & Plan Note (Signed)
No recent COPD exacerbations. Maintained on 2-3 L of oxygen. Stable

## 2014-09-18 NOTE — Assessment & Plan Note (Signed)
Currently with no symptoms of angina. No further workup at this time. Continue current medication regimen. 

## 2014-09-19 LAB — BASIC METABOLIC PANEL
BUN / CREAT RATIO: 12 (ref 10–22)
BUN: 16 mg/dL (ref 8–27)
CHLORIDE: 100 mmol/L (ref 97–108)
CO2: 22 mmol/L (ref 18–29)
Calcium: 8.9 mg/dL (ref 8.6–10.2)
Creatinine, Ser: 1.33 mg/dL — ABNORMAL HIGH (ref 0.76–1.27)
GFR calc non Af Amer: 50 mL/min/{1.73_m2} — ABNORMAL LOW (ref >59)
GFR, EST AFRICAN AMERICAN: 58 mL/min/{1.73_m2} — AB (ref >59)
GLUCOSE: 100 mg/dL — AB (ref 65–99)
Potassium: 3.9 mmol/L (ref 3.5–5.2)
Sodium: 138 mmol/L (ref 134–144)

## 2014-09-23 ENCOUNTER — Encounter: Payer: Self-pay | Admitting: *Deleted

## 2014-11-04 ENCOUNTER — Encounter: Payer: Self-pay | Admitting: *Deleted

## 2014-12-22 ENCOUNTER — Encounter: Payer: Self-pay | Admitting: *Deleted

## 2014-12-29 ENCOUNTER — Ambulatory Visit: Payer: Self-pay | Admitting: Vascular Surgery

## 2014-12-29 LAB — BASIC METABOLIC PANEL
ANION GAP: 5 — AB (ref 7–16)
BUN: 31 mg/dL — ABNORMAL HIGH (ref 7–18)
Calcium, Total: 9.1 mg/dL (ref 8.5–10.1)
Chloride: 107 mmol/L (ref 98–107)
Co2: 27 mmol/L (ref 21–32)
Creatinine: 1.48 mg/dL — ABNORMAL HIGH (ref 0.60–1.30)
GFR CALC AF AMER: 59 — AB
GFR CALC NON AF AMER: 48 — AB
Glucose: 98 mg/dL (ref 65–99)
Osmolality: 284 (ref 275–301)
Potassium: 4 mmol/L (ref 3.5–5.1)
SODIUM: 139 mmol/L (ref 136–145)

## 2015-01-26 ENCOUNTER — Encounter: Payer: PRIVATE HEALTH INSURANCE | Admitting: Internal Medicine

## 2015-02-02 ENCOUNTER — Ambulatory Visit (INDEPENDENT_AMBULATORY_CARE_PROVIDER_SITE_OTHER): Payer: Medicare Other | Admitting: Internal Medicine

## 2015-02-02 ENCOUNTER — Encounter: Payer: Self-pay | Admitting: Internal Medicine

## 2015-02-02 VITALS — BP 106/68 | HR 92 | Ht 71.0 in | Wt 161.0 lb

## 2015-02-02 DIAGNOSIS — I4729 Other ventricular tachycardia: Secondary | ICD-10-CM

## 2015-02-02 DIAGNOSIS — I5022 Chronic systolic (congestive) heart failure: Secondary | ICD-10-CM

## 2015-02-02 DIAGNOSIS — I472 Ventricular tachycardia: Secondary | ICD-10-CM | POA: Diagnosis not present

## 2015-02-02 DIAGNOSIS — I255 Ischemic cardiomyopathy: Secondary | ICD-10-CM | POA: Diagnosis not present

## 2015-02-02 DIAGNOSIS — Z95810 Presence of automatic (implantable) cardiac defibrillator: Secondary | ICD-10-CM

## 2015-02-02 DIAGNOSIS — R079 Chest pain, unspecified: Secondary | ICD-10-CM | POA: Diagnosis not present

## 2015-02-02 LAB — MDC_IDC_ENUM_SESS_TYPE_INCLINIC
Battery Voltage: 2.96 V
Brady Statistic AP VP Percent: 0.05 %
Brady Statistic AP VS Percent: 34.6 %
Brady Statistic AS VS Percent: 65.32 %
Brady Statistic RV Percent Paced: 0.08 %
Date Time Interrogation Session: 20160209140537
HighPow Impedance: 38 Ohm
HighPow Impedance: 47 Ohm
Lead Channel Impedance Value: 448 Ohm
Lead Channel Pacing Threshold Amplitude: 1 V
Lead Channel Pacing Threshold Amplitude: 1.5 V
Lead Channel Pacing Threshold Pulse Width: 0.4 ms
Lead Channel Sensing Intrinsic Amplitude: 19.3 mV
Lead Channel Setting Pacing Amplitude: 2.5 V
MDC IDC MSMT LEADCHNL RA SENSING INTR AMPL: 2.6 mV
MDC IDC MSMT LEADCHNL RV IMPEDANCE VALUE: 360 Ohm
MDC IDC MSMT LEADCHNL RV PACING THRESHOLD PULSEWIDTH: 0.6 ms
MDC IDC SET LEADCHNL RA PACING AMPLITUDE: 2 V
MDC IDC SET LEADCHNL RV PACING PULSEWIDTH: 0.6 ms
MDC IDC SET LEADCHNL RV SENSING SENSITIVITY: 0.3 mV
MDC IDC SET ZONE DETECTION INTERVAL: 400 ms
MDC IDC STAT BRADY AS VP PERCENT: 0.03 %
MDC IDC STAT BRADY RA PERCENT PACED: 34.65 %
Zone Setting Detection Interval: 280 ms
Zone Setting Detection Interval: 320 ms
Zone Setting Detection Interval: 350 ms
Zone Setting Detection Interval: 450 ms
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
## 2015-02-02 NOTE — Patient Instructions (Signed)
Stop Amiodarone 200 mg.  Follow up with Dr. Graciela HusbandsKlein in 6 months.  Follow in 3 months with device clinic.

## 2015-02-05 ENCOUNTER — Ambulatory Visit: Payer: Self-pay

## 2015-02-26 IMAGING — CT CT HEAD WITHOUT CONTRAST
4 series · 18 of 30 positions shown, 19 images · non-contrast
Comparison: CT of the head April 30, 2014

CLINICAL DATA: Left-sided weakness for 2 weeks, multiple falls.

EXAM:
CT HEAD WITHOUT CONTRAST
TECHNIQUE: Contiguous axial images were obtained from the base of the skull
through the vertex without intravenous contrast.

[Series 2: head bone · axial · 0.42mm/px · z∈[+1035,+1169]mm · 8 of 85 slices shown]
[im 9/85  bone]
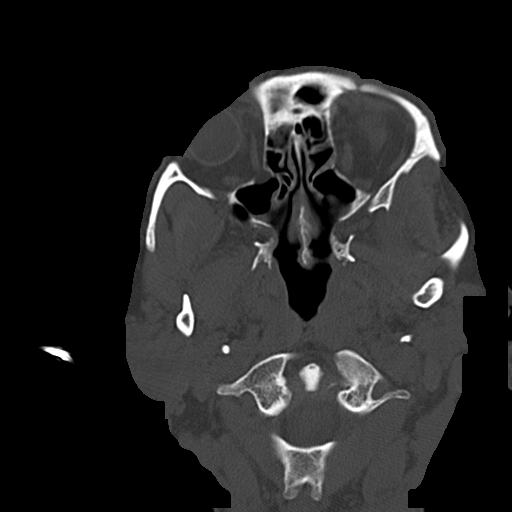
[im 17/85  bone]
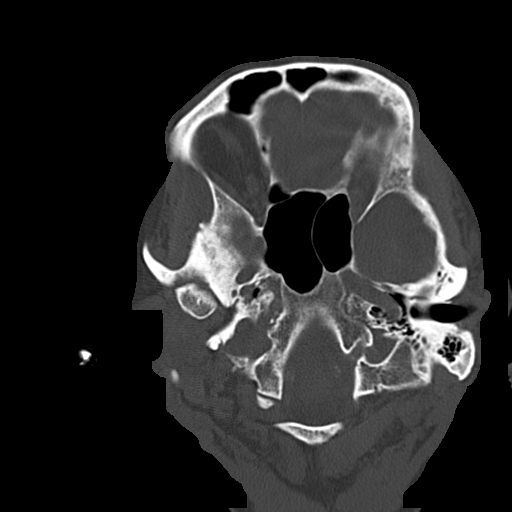
[im 26/85  bone]
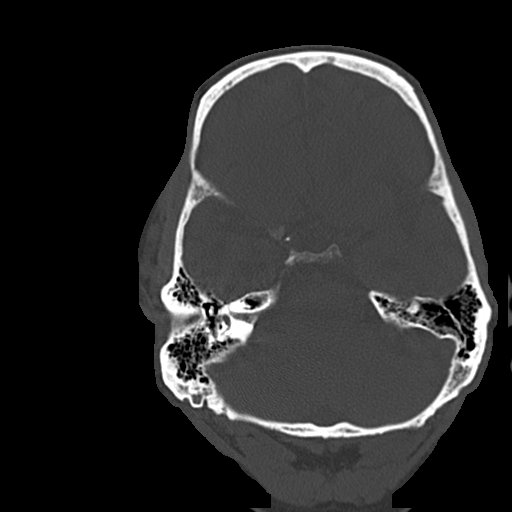
[im 34/85  bone]
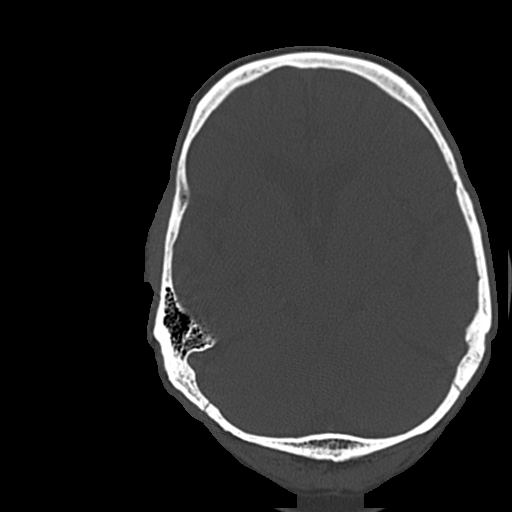
[im 51/85  bone]
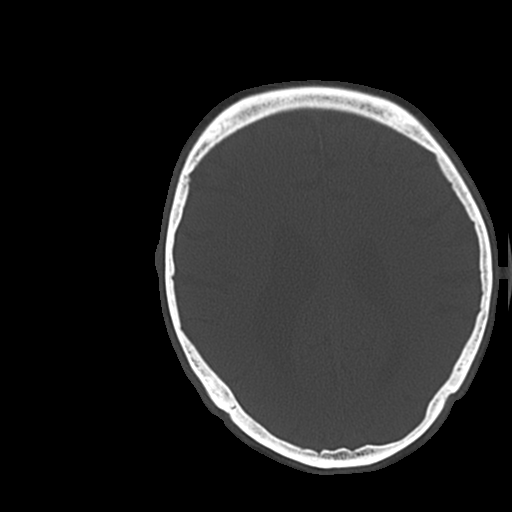
[im 59/85  bone]
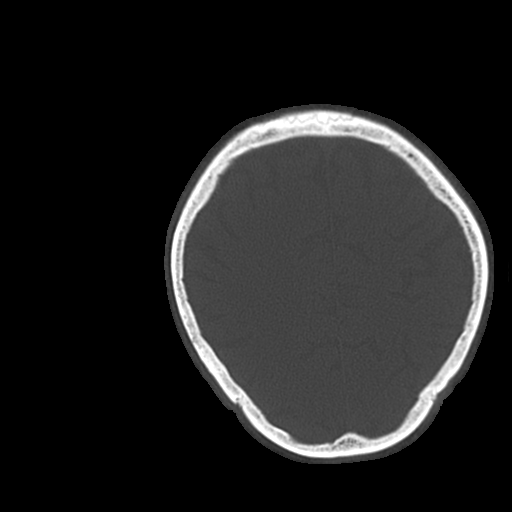
[im 68/85  bone]
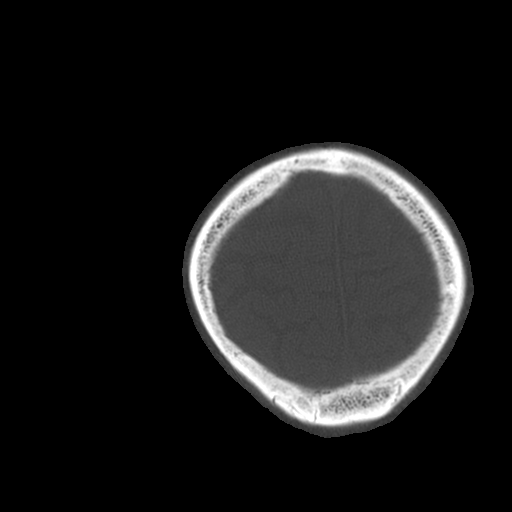
[im 76/85  bone]
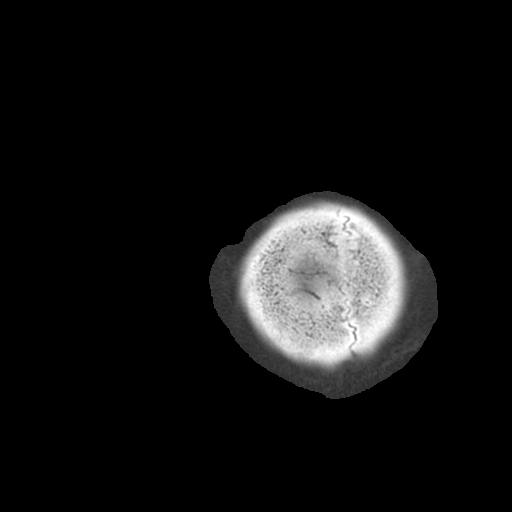

[Series 3: head wo · axial · 0.42mm/px · z∈[+1078,+1128]mm · 2 of 31 slices shown, 3 images]
[im 11/31  brain]
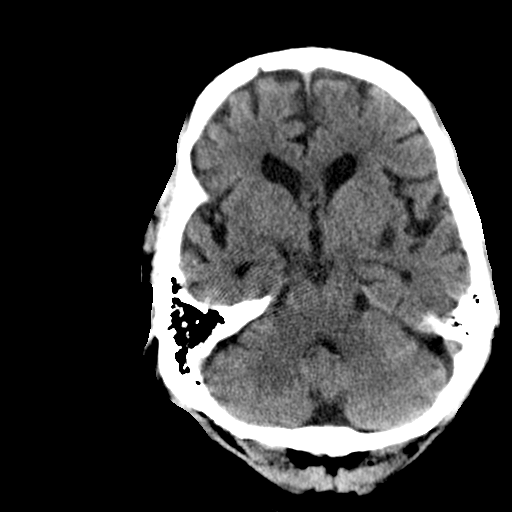
[im 11/31  bone]
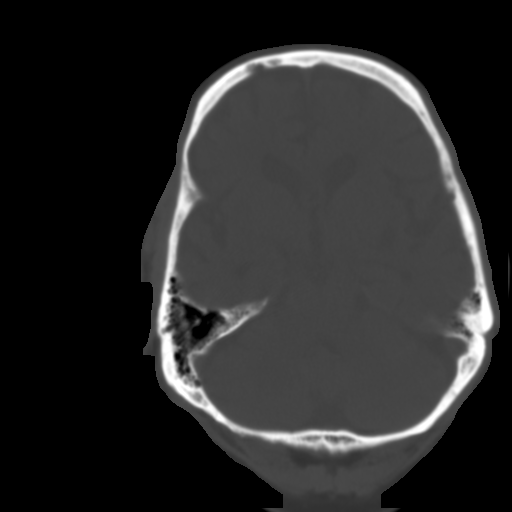
[im 21/31  brain]
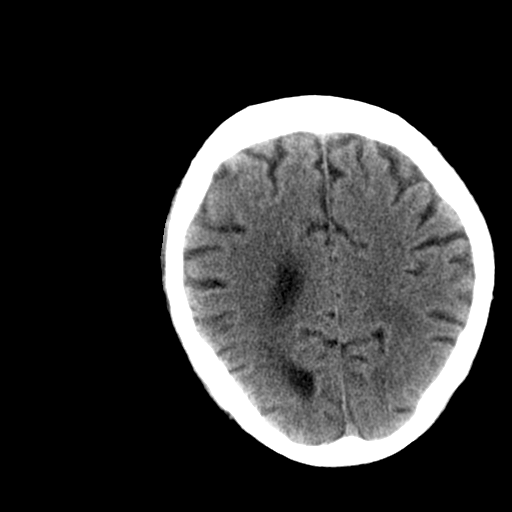

[Series 4: head wo recon · axial · 0.41mm/px · z∈[+1086,+1136]mm · 2 of 31 slices shown]
[im 11/31  brain]
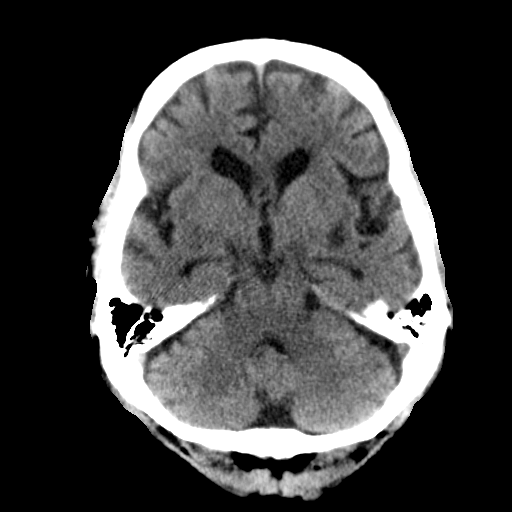
[im 21/31  brain]
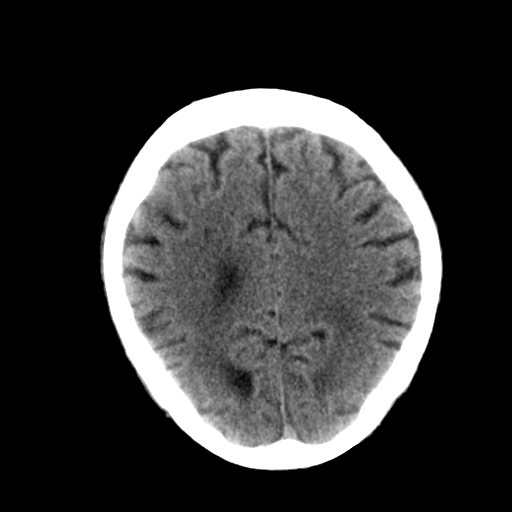

[Series 5: head bone recon · axial · 0.42mm/px · z∈[+1048,+1144]mm · 6 of 81 slices shown]
[im 9/81  bone]
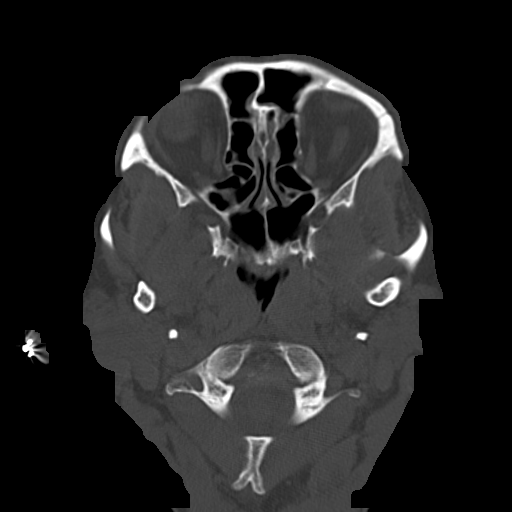
[im 17/81  bone]
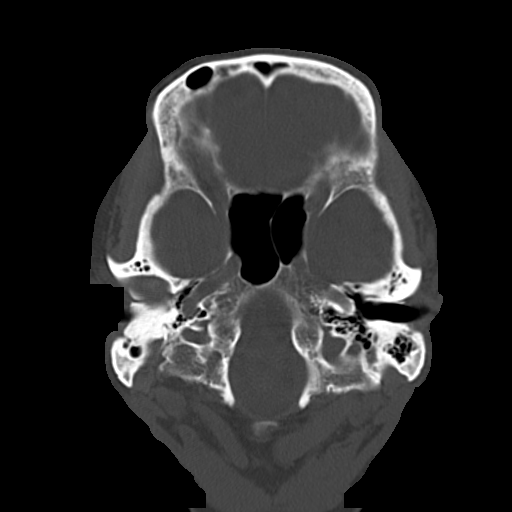
[im 25/81  bone]
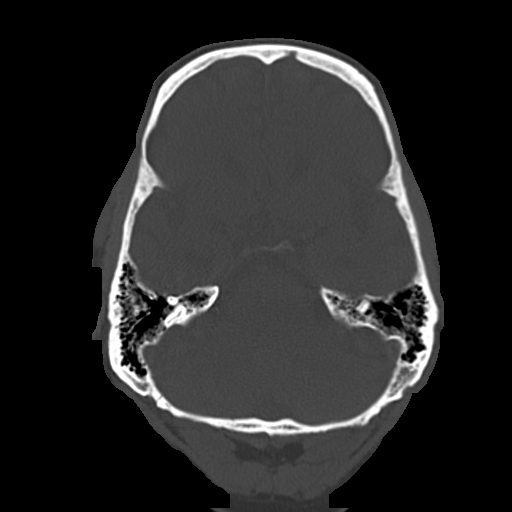
[im 33/81  bone]
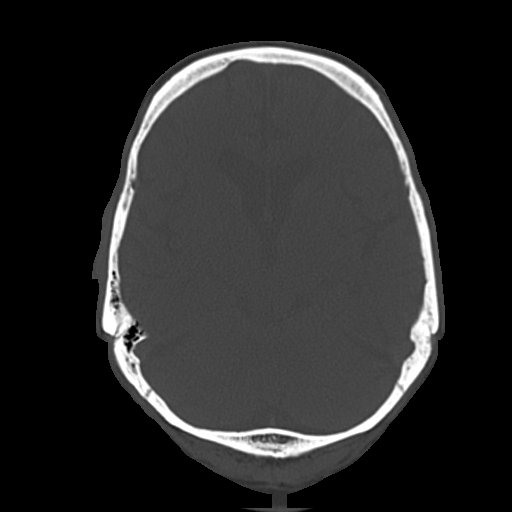
[im 49/81  bone]
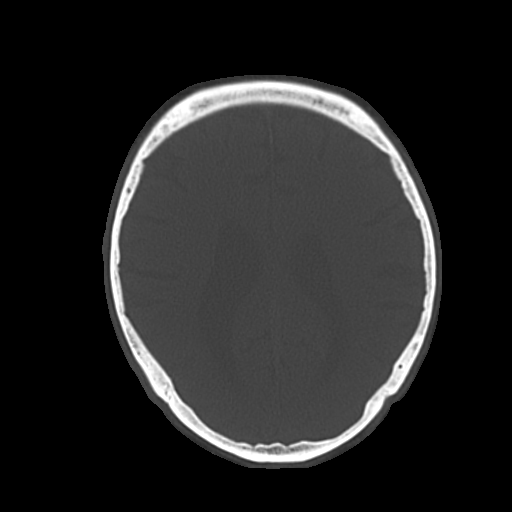
[im 57/81  bone]
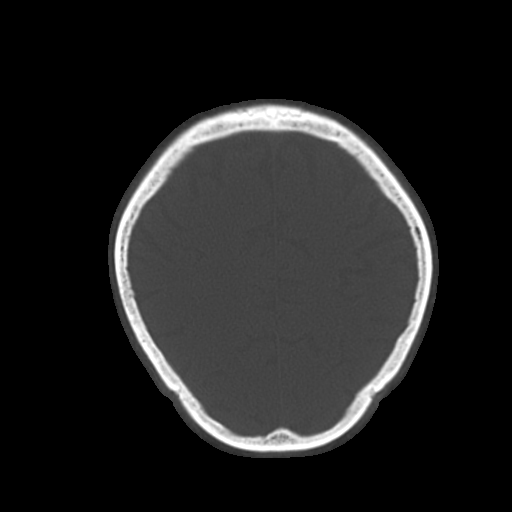

[18 of 30 positions shown; findings below may reference images not displayed]

FINDINGS: The ventricles and sulci are normal for age. No intraparenchymal
hemorrhage, mass effect nor midline shift. Patchy supratentorial
white matter hypodensities are less than expected for patient's age
and though non-specific suggest sequelae of chronic small vessel
ischemic disease. No acute large vascular territory infarcts. Left
inferior basal ganglia apparent perivascular space, less likely
lacunar infarct.

No abnormal extra-axial fluid collections. Basal cisterns are
patent. Moderate calcific atherosclerosis of the carotid siphons.

No skull fracture. The included ocular globes and orbital contents
are non-suspicious. Mild paranasal sinus mucosal thickening without
air-fluid levels. The visualized mastoid air cells are well aerated.
Moderate temporomandibular osteoarthrosis.
IMPRESSION: No acute intracranial process.

Stable appearance of the head from April 30, 2014: Involutional
changes. Mild white matter changes suggest chronic small vessel
ischemic disease.

  By: Asbonge Gonya

## 2015-03-17 ENCOUNTER — Encounter: Payer: Self-pay | Admitting: Cardiovascular Disease

## 2015-03-17 ENCOUNTER — Ambulatory Visit (INDEPENDENT_AMBULATORY_CARE_PROVIDER_SITE_OTHER): Payer: Medicare Other | Admitting: Cardiovascular Disease

## 2015-03-17 VITALS — BP 100/60 | HR 75 | Ht 71.0 in | Wt 166.3 lb

## 2015-03-17 DIAGNOSIS — I5022 Chronic systolic (congestive) heart failure: Secondary | ICD-10-CM

## 2015-03-17 DIAGNOSIS — R079 Chest pain, unspecified: Secondary | ICD-10-CM

## 2015-03-17 DIAGNOSIS — I4891 Unspecified atrial fibrillation: Secondary | ICD-10-CM

## 2015-03-17 DIAGNOSIS — J302 Other seasonal allergic rhinitis: Secondary | ICD-10-CM

## 2015-03-17 DIAGNOSIS — Z95810 Presence of automatic (implantable) cardiac defibrillator: Secondary | ICD-10-CM

## 2015-03-17 DIAGNOSIS — J441 Chronic obstructive pulmonary disease with (acute) exacerbation: Secondary | ICD-10-CM

## 2015-03-17 DIAGNOSIS — I25118 Atherosclerotic heart disease of native coronary artery with other forms of angina pectoris: Secondary | ICD-10-CM

## 2015-03-17 NOTE — Assessment & Plan Note (Signed)
Currently on anticoagulation. No recurrent stroke  maintaining normal sinus rhythm

## 2015-03-17 NOTE — Assessment & Plan Note (Signed)
Currently with no symptoms of angina. No further workup at this time. Continue current medication regimen. 

## 2015-03-17 NOTE — Assessment & Plan Note (Signed)
He appears to have active bronchitis, starting one week ago, significant productive cough. We will start treatment with Z-Pak, recommended close follow-up with Dr. Terance HartBronstein.
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
## 2015-03-17 NOTE — Patient Instructions (Signed)
Please start a z-pak, 2 pills the first day, then one pill a day for 4 more days (total of 5 days) for bronchitis  Start cetirazine (zyrtec) one pill a day for allergies  Please call the office or Dr. Terance Hartbronstein if your cough does not start to get better  Please call us if you have new issues that need to be addressed before your next appt.  Your physician wants you to follow-up in: 6 months.  You will receive a reminder letter in the mail two months in advance. If you don't receive a letter, please call our office to schedule the follow-up appointment.

## 2015-03-17 NOTE — Assessment & Plan Note (Signed)
He appears euvolemic on today's visit, possibly even mildly dehydrated by recent lab work January 2016. No changes made to his medications

## 2015-03-17 NOTE — Assessment & Plan Note (Signed)
Recommended he start Zyrtec daily for itchy eyes, runny nose

## 2015-03-17 NOTE — Assessment & Plan Note (Signed)
Followed by Dr. Klein 

## 2015-04-16 NOTE — H&P (Signed)
PATIENT NAME:  [name], [name] MR#:  [id number] DATE OF BIRTH:  [birth year]  DATE OF ADMISSION:  07/09/2013  PRIMARY CARE PHYSICIAN: [facility]  CHIEF COMPLAINT:  Shortness of breath, lower extremity edema.   HISTORY OF PRESENTING ILLNESS:  An 81 year old African-American male patient who recently moved to [place], who ran out of his medications. [facility]. The patient was saturating 88% on room air in the clinic, and was asked to go to the ER for CHF. The patient complains of worsening lower extremity swelling, about a 20-pound weight gain over the past week. Has had some orthopnea, shortness of breath. Complains of some white sputum. No chest pain. The patient mentions that he was on a water pill, which he ran out of. He has also run out of multiple medications.   PAST MEDICAL HISTORY: Hypertension, atrial fibrillation, congestive heart failure, coronary artery disease, asthma, abdominal aortic aneurysm, benign prostatic hypertrophy.   PAST SURGICAL HISTORY:  Cardiac cath and pacemaker placement.   FAMILY HISTORY:  Hypertension, coronary artery disease.   SOCIAL HISTORY: The patient does not smoke. No alcohol. No illicit drugs. Moved to [place] from [place] recently.   ALLERGIES:  No known drug allergies.   REVIEW OF SYSTEMS:    CONSTITUTIONAL:  Complains of some fatigue and weight gain.  EYES:  No blurred vision, pain, redness.  EARS, NOSE, THROAT:  No tinnitus, ear pain, hearing loss.  RESPIRATORY:  Has cough, white sputum, and occasional wheezing. Has asthma.   CARDIOVASCULAR:  No chest pain. Has orthopnea and lower extremity edema.  GASTROINTESTINAL:  No nausea, vomiting, diarrhea, abdominal pain.  GENITOURINARY:  No dysuria, hematuria, frequency.  ENDOCRINE: No polyuria, nocturia, thyroid problems.  HEMATOLOGIC/LYMPHATIC:  No anemia, easy bruising, bleeding.  INTEGUMENTARY:  No acne, rash, lesions.   MUSCULOSKELETAL: No back pain, arthritis.  NEUROLOGIC:  No focal numbness, weakness, dysarthria, seizures.  PSYCHIATRIC:  No anxiety or depression.  HOME MEDICATION LIST:  These are the medications that patient was taking in [place], but has run out of them.  1.  Acetaminophen/hydrocodone 325/7.5, 1 tablet oral every 6 hours as needed.  2.  Advair  115/21, 2 puffs inhaled 2 times a day.  3.  Aspirin 81 mg oral daily.  4.  Soma 350 mg oral 3 times a day.  5.  Latanoprost 1 drop in each eye once a day.  6.  Metolazone 5 mg oral 2 times a day. 7.  ProAir HFA 2 puffs inhaled 4 times a day. 8.  Tamsulosin 0.4 mg oral once a day.  9.  Tramadol 50 mg oral 2 times a day.   PHYSICAL EXAMINATION: VITAL SIGNS: Temperature 98.3, pulse of 85, respirations 24, blood pressure of 120/74, saturating 88% on room air and 99% on 2 liters oxygen.  GENERAL:  Obese African-American male patient sitting up in bed in mild respiratory distress.  PSYCHIATRIC:  Alert and oriented x 3. Mood and affect appropriate. Judgment intact.  HEENT: Atraumatic, normocephalic. Oral mucosa moist and pink. External ears and nose normal. No pallor. No icterus. Pupils bilaterally equal and react to light.  NECK:  Supple. No thyromegaly. No palpable lymph nodes. Trachea midline. No carotid bruits or  JVD.  CARDIOVASCULAR: S1, S2, without any murmurs. Peripheral pulses 2+. Lower extremity edema 2+.  RESPIRATORY:  Bilateral crackles along with expiratory wheezes. Decreased air entry.  GASTROINTESTINAL: Soft abdomen, nontender. Bowel sounds present. No hepatosplenomegaly palpable.  GENITOURINARY:  No CVA tenderness or bladder  distention.  SKIN:  Warm and dry. No petechiae, rash, ulcers.  MUSCULOSKELETAL:  No joint swelling, redness, effusion of the large joints. Normal muscle tone.  NEUROLOGICAL:  Motor strength 5/5 in upper and lower extremities. Sensation to fine touch intact all over. Cranial nerves II through XII intact.   LYMPHATIC: No cervical, supraclavicular lymphadenopathy.   LAB STUDIES:  Show BNP of 11,500, glucose 115, BUN 39, creatinine 1.44, sodium 140, potassium 4.4, chloride 106, bicarb 28, GFR of 46, calcium 9.1. AST, ALT, alkaline phosphatase  normal. CK of 169. Troponin 0.04. WBC 5, hemoglobin 11.5, platelets of 163.   EKG shows sinus rhythm with occasional PVCs, low voltage complexes, nonspecific ST-T wave changes, incomplete right bundle branch block.   Chest x-ray shows pulmonary edema, bilateral basilar atelectasis.   ASSESSMENT AND PLAN: 1.  Acute respiratory failure secondary to acute on chronic congestive heart failure, along with acute asthma exacerbation. The patient has crackles on exam and pulmonary edema on chest x-ray, along with wheezing.   2.  Acute on chronic congestive heart failure, unknown if systolic or diastolic. Will get an echocardiogram. Will start patient on Lasix, also his metolazone, which he ran out of. Ins and outs, and fluid restriction, with salt restriction. Will get an echocardiogram to check on ejection fraction and valvular dysfunctions. Will consult Cardiology for input, as patient will need outpatient followup after discharge.   3.  Acute asthma exacerbation. The patient does have expiratory, wheezing with acute respiratory failure. Saturations were 88% on room air. Will start him on IV Solu-Medrol and nebulizers around the clock.  No fever. Normal white count.   4.  Paroxysmal atrial fibrillation. Presently is in normal sinus rhythm. Will start patient on aspirin. Depending on his ejection fraction, he might need  further evaluation of anticoagulants.   5. Hypertension. The patient was not on any antihypertensives. Not on  his medication list, although he has history of hypertension, and his blood pressure is well-controlled. Will monitor at this time.   6.  Coronary artery disease. Stable. Check 2 more sets of cardiac enzymes.   7.  Deep vein thrombosis  prophylaxis with heparin.   8.  Code status: FULL CODE.   Time spent on this case was 50 minutes.   ____________________________ Molinda Bailiff Aracelys Glade, MD srs:mr D: 07/09/2013 19:56:32 ET T: 07/09/2013 20:20:31 ET JOB#: [id number]  cc: Wardell Heath R. Ciana Simmon, MD, <Dictator> [facility]  Orie Fisherman MD ELECTRONICALLY SIGNED 07/13/2013 23:43

## 2015-04-16 NOTE — Discharge Summary (Signed)
PATIENT NAME:  [name], [name] MR#:  [id number] DATE OF BIRTH:  August 14, 1930  DATE OF ADMISSION:  07/09/2013 DATE OF DISCHARGE:  07/15/2013  For a detailed note, please take a look at the history and physical done on admission by Dr. Elpidio AnisSudini.    DIAGNOSES AT DISCHARGE: 1.  Acute respiratory failure. Likely secondary to decompensated congestive heart failure.  2.  Acute on chronic systolic congestive heart failure.  3.  Chronic obstructive pulmonary disease exacerbation secondary to acute bronchitis.  4.  Hypertension.  5.  Severe cardiomyopathy, ejection fraction of less than 20% status post automatic implantable cardiac defibrillator.  6.  Benign prostatic hypertrophy.  7.  Chronic kidney disease stage III.  8.   Medical noncompliance.   DIET: The patient is being discharged on a low-sodium, low-fat diet.   ACTIVITY: As tolerated.   FOLLOW-UP: Dr. Vonita MossMark [facility] in the next 1 to 2 weeks. Also follow up in 1 week with Dr. Julien Nordmannimothy Gollan. The patient is also to follow up for his chronic kidney disease as a new appointment with [facility].    DISCHARGE MEDICATIONS: Aspirin 81 mg daily, albuterol inhaler 2 puffs q.i.d. as needed, Flomax 0.4 mg daily, latanoprost one drop to each eye at bedtime, brimonidine 0.15% ophthalmic solution one drop to each eye b.i.d., tramadol 50 mg b.i.d. as needed, soma 350 mg t.i.d., Tylenol with hydrocodone 325/7.5 one tab q. 6 hours as needed, Advair 250/50 one puff b.i.d., Coreg 3.125 mg b.i.d., prednisone taper starting at 60 mg down to 10 mg over the next 6 days, lisinopril 2.5 mg daily, Levaquin 5 mg x 7 days, amiodarone 200 mg b.i.d. and torsemide 20 mg b.i.d.   CONSULTANTS DURING THE HOSPITAL COURSE: Dr. Julien Nordmannimothy Gollan from cardiology.   PERTINENT STUDIES DONE DURING THE HOSPITAL COURSE: Are as follows: A chest x-ray done on July 16 showing findings consistent with low-grade congestive heart failure.   A 2-dimensional echocardiogram done on July 17 showing  left ventricular ejection fraction to be 20% to 25%, severely decreased global LV systolic function, mild left ventricular hypertrophy, moderately dilated left atrium, severely dilated right atrium,  severe tricuspid regurgitation, mild aortic valve sclerosis without stenosis, mild to moderate aortic regurgitation, mildly elevated pulmonary artery systolic pressures.   HOSPITAL COURSE: This is an 81 year old male with medical problems as mentioned above, presented to the hospital on 07/09/2013 due to shortness of breath and worsening lower extremity edema.  1.  Acute on chronic respiratory failure. The likely source of the patient's respiratory failure was probably a combination of mild chronic obstructive pulmonary disease exacerbation and decompensated congestive heart failure. The patient apparently had a 20 pound weight gain and also had significant lower extremity edema and scrotal edema. The patient was actually treated for both conditions, started on IV steroids, also oral doxycycline, around-the-clock nebulizer treatments, maintained on his Advair. He was also aggressively diuresed with IV Lasix. He was already on oxygen prior to coming to the hospital. Although he had just moved from [place] about a month ago, had ran out of his oxygen and did not have at home prior to coming into the hospital. The patient,  after aggressive diuresis and treatment for his chronic obstructive pulmonary disease, has significantly improved. He is back down to his baseline status. He does need home oxygen, which is being arranged for him. He was ambulated on room air and desaturated to low below 85%. At this point, the patient is being discharged on oral torsemide for his congestive heart  failure along with beta blockers and low-dose ACE inhibitors and also on a prednisone taper and empiric Levaquin as stated for his chronic obstructive pulmonary disease.  2.  Acute on chronic systolic congestive heart failure. The  patient has a severe cardiomyopathy, ejection fraction of 20% to 25%. He presented with a 20 pound weight gain or with severe anasarca. He was aggressively diuresed with IV Lasix. He is about 8.5 liters negative since admission and clinically significantly improved. At present, the patient is being discharged on oral Demadex and some Coreg and ACE inhibitor as stated. He will have close follow-up with cardiology as an outpatient. The patient was seen by Dr. Mariah Milling from [facility], who will continue follow-up the patient as an outpatient.  3. Chronic obstructive pulmonary disease exacerbation. This was likely secondary to acute bronchitis. Initially, the patient was treated with IV steroids. He has now been switched over to oral prednisone taper. He will continue his DuoNeb, he will continue his Advair and he has been given a prescription for Levaquin for the next few days. He continues to have some productive cough, but should improve with the Levaquin. He is being discharged on.  4.  Intermittent ventricular tachycardia. The patient has had significant ectopy on the telemetry monitor here in the hospital with multiple episodes of ectopy including ventricular tachycardia. This is likely secondary to his severe cardiomyopathy. The patient has been clinically asymptomatic with this. As per cardiology, they did start the patient on some low-dose amiodarone, which he is currently being discharged on.  He already has an automatic implantable cardiac defibrillator in place. He will continue his beta blocker as stated.  5.  Chronic kidney disease. The patient's creatinine is currently at baseline. He was not following a nephrologist when he was in [place],  although I am referring him to see a nephrologist while he is here. His creatinine on date of discharge is 1.3. It was as high as 1.5 while in the hospital and should be further watched given his heart failure and also as he is going to be on aggressive  diuresis.  6.  Benign prostatic hypertrophy. The patient was maintained on his Flomax, he will resume that.   CODE STATUS: The patient is a full code.   DISPOSITION: He is being discharged home with home health physical therapy and nursing services.   TIME SPENT: 40 minutes   ____________________________ Rolly Pancake. Cherlynn Kaiser, MD vjs:cc D: 07/15/2013 16:49:57 ET T: 07/15/2013 17:06:05 ET JOB#: [id number]  cc: Rolly Pancake. Cherlynn Kaiser, MD, <Dictator> Antonieta Iba, MD [facility]  Houston Siren MD ELECTRONICALLY SIGNED 07/26/2013 15:14

## 2015-04-16 NOTE — Discharge Summary (Signed)
PATIENT NAME:  [name], Hudsen MR#:  [id number] DATE OF BIRTH:  09-11-1930  DATE OF ADMISSION:  10/02/2013 DATE OF DISCHARGE:  10/04/2013  DISCHARGE DIAGNOSES:  1.  Acute systolic heart failure.  2.  Acute respiratory failure.  3.  Asthma.  4.  Hypertension.  5.  Weakness.  6.  Atrial fibrillation.  7.  Coronary artery disease.   CONDITION ON DISCHARGE: Stable.   CODE STATUS:  Full code.  DISCHARGE MEDICATIONS:  1.  Aspirin enteric-coated 81 mg.  2.  Tamsulosin 0.4 mg once a day.  3.  Latanprost ophthalmic solution 0.005% one drop each eye once a day.  4.  Brimonidine 0.15% ophthalmic solution one drop each eye 2 times a day.  5.  Advair 2 puffs 2 times a day.  6.  Carvedilol 3.125 mg 2 times a day.  7.  Lisinopril 2.5 mg once a day. 8.  Amiodarone 20 mg 2 times a day.  9.  ProAir 2 puffs inhalation 4 times a day.  10.  Prednisone 10 mg oral tablet, start at 60 mg and taper x 10 mg daily until complete.  11.  Spiriva 18 mcg inhalation capsule once a day.  12.  Torsemide 20 mg oral tablet 2 times a day.   HOME HEALTH ON DISCHARGE: Yes.   HOME OXYGEN: 3 liters nasal cannula.   DISCHARGE DIET: Low sodium, low fat, low cholesterol, regular consistency.  FOLLOW UP:  Advised to follow with Dr.  Mariah MillingGollan within 1 to 2 weeks.   HISTORY OF PRESENTING ILLNESS: As per H and P done by me on 9 October.  An 81 year old male with past medical history of severe congestive heart failure, ejection fraction 25%, status post AICD, atrial fibrillation, coronary artery disease, asthma, abdominal aortic aneurysm, benign prostatic hypertrophy. Lives with his sister. As per the sister, he was fine a day before, but on the day of presentation, he was not feeling good.  He was breathing very heavy, and so sister also noticed him confused and called EMS. By EMS, he was found having CO2 retention and started on BiPAP for congestive heart failure exacerbation in the ER, and he was admitted for congestive heart  failure exacerbation.   HOSPITAL COURSE AND STAY: Initially, after a few hours of BiPAP and IV Lasix, the patient had diuresis, and his CO2 level came down, mental status improved, so he was switched on nasal cannula oxygen. Was feeling totally fine. He had almost 4 liters negative fluid balance with IV Lasix hospital in 2 days in the hospital. Acute respiratory failure, which was present on admission due to hypercapnic respiratory failure, improved significantly, and continued on nasal cannula, which was his baseline.  Atrial fibrillation. The patient was sinus rhythm.  The patient had AICD placement.  He was taking amiodarone and carvedilol at home. We held it on admission because of borderline low blood pressure and need of IV Lasix, but we restarted on discharge.   Asthma versus chronic obstructive pulmonary disease. He was using inhalers at home. We started on IV steroids, and his breathing overall improved in the next two days.   Coronary artery disease, status post CABG. We continued aspirin. Beta blocker was on hold in hospital because of borderline low blood pressure and IV Lasix, which was resumed on discharge.  CONSULT IN THE HOSPITAL: Dr. Mariah MillingGollan for cardiology consult.  IMPORTANT LABORATORY RESULTS:  CK-MB was 6.1 on intubation. Creatinine was 1.46. BNP was 8340. Chest x-ray, PA and lateral, was bilateral lung  base atelectasis with mild interstitial thickening of congestive heart failure. Urinalysis was were negative. On ER, ABG was pH 7.30, pCO2 was 74, and pO2 was 234 on 60% FiO2 on BiPAP. Repeat BiPAP after 4 hours off BiPAP ABG was pH 7.30, pCO2 was 75, and pO2 was 65 on 21% FiO2 on room air. Troponin was 0.1 on presentation. Creatinine improved to 1.42.  Troponin came down to 0.06. Chest x-ray on follow-up was reported as stable chest x-ray  TOTAL TIME SPENT ON THIS DISCHARGE: 40 minutes.    ____________________________ Hope Pigeon Elisabeth Pigeon, MD vgv:cg D: 10/06/2013 22:42:17  ET T: 10/07/2013 00:55:31 ET JOB#: [id number]  cc: Hope Pigeon. Elisabeth Pigeon, MD, <Dictator> Antonieta Iba, MD Altamese Dilling MD ELECTRONICALLY SIGNED 10/21/2013 0:19

## 2015-04-16 NOTE — H&P (Signed)
PATIENT NAME:  [name], [name] MR#:  [id number] DATE OF BIRTH:  [birth year]  DATE OF ADMISSION:  10/02/2013  PRIMARY CARDIOLOGIST: Dr. Mariah Milling in [facility].   REFERRING PHYSICIAN: Dr. Cyril Loosen.   CHIEF COMPLAINT: Shortness of breath and confusion.   HISTORY OF PRESENT ILLNESS: An 81 year old male with past medical history of severe congestive heart failure, ejection fraction 25%, status post AICD; atrial fibrillation, coronary artery disease, asthma, abdominal aortic aneurysm, benign prostatic hypertrophy, who lives with his sister, and his sister takes care of him. As per the sister, was totally fine until yesterday, but today he complains he is not feeling good, and he was breathing very heavy, as per the sister, and then he got confused, and so she called EMS and brought him over here. In ER, he was found having tachypnea and hypoxic and on ABG, he had CO2 retention, so he was started on BiPAP for CHF exacerbation and hospitalist service is being contacted for further management. On questioning, the patient is not able to give me any more details, and he told me to ask his sister for all details, and he gave me the number of his sister.  REVIEW OF SYSTEMS:  Unable to get it as the patient is not willing to tell me anything, but as per the sister, there was no fever, cough, and he took all of his medications regularly. There were no injuries.   PAST MEDICAL HISTORY:  1.  Hypertension.  2.  Atrial fibrillation.  3.  Congestive heart failure.  4.  Coronary artery disease.  5.  Asthma.  6.  Abdominal aortic aneurysm.  7.  Benign prostatic hypertrophic.   PAST SURGICAL HISTORY: Cardiac cath and pacemaker placement.   FAMILY HISTORY: Hypertension, coronary artery disease.   SOCIAL HISTORY: Does not smoke. No alcohol. No illicit drug use. Moved from [place], and follows with Dr. Mariah Milling. Lives with his sister, who takes care of his day-to-day care.  HOME MEDICATIONS:  1.  Tramadol 50 mg  2 times a day.  2.  Torsemide 20 mg 2 times a day.  3.  Tamsulosin 0.4 mg once a day.  4.  ProAir HFA 2 puffs 4 times a day.  5.  Lisinopril 2.5 mg once a day.  6.  Latanoprost ophthalmic drops 1 drop each eye at bedtime.  7.  Carvedilol 3.125 mg 2 times a day.  8.  Carisoprodol 350 mg oral 3 times a day.  9.  Brimonidine ophthalmic 1 drop each eye 2 times a day.  10.  Aspirin enteric-coated 81 mg once a day.  11.  Amiodarone 200 mg oral every 12 hours.  12.  Advair 2 puffs inhaled 2 times a day.  13.  Acetaminophen every 6 hours as needed.   PHYSICAL EXAMINATION: VITAL SIGNS: In ER on presentation, blood pressure was 131/71, respiratory rate was 22, pulse was 62, blood pressure dropped to 84/47, but then with BiPAP and with respiratory coming under control, blood pressure came up to 118/78, respiratory 20, heart rate is 74  and oxygen saturation 100% with BiPAP supplemental oxygen.  GENERAL: The patient appears in no respiratory distress. He is using BiPAP currently. He is alert, but does not appear to be properly oriented, though he is able to remember his sister's phone number.  HEAD AND NECK: Atraumatic. Conjunctiva pink. Oral mucosa moist. Neck: Supple. No JVD.  RESPIRATORY: Bilateral equal air entry. Crepitation present.  CARDIOVASCULAR: S1, S2 present, regular. AICD in place. No murmur.  ABDOMEN: Soft,  nontender. Bowel sounds present. No organomegaly.  SKIN: No rashes.  EXTREMITIES:  Legs: Mild edema present, bilateral.  NEUROLOGICAL: Power 5/5. Moves all 4 limbs.  JOINTS: No swelling or tenderness.   IMPORTANT LABORATORY RESULTS:  Glucose 162, BUN 16, creatinine 1.46. In the past, creatinine was 1.29 on August 2014. Sodium 132, potassium 4.7, chloride 97 and CO2 of 27. Troponin 0.02. WBC 6.1, hemoglobin 13.4, platelet count 161. INR is 1.1. Urinalysis is grossly negative. ABG: PH 7.30, pCO2 is 74, and pO2 is 234 on 60% FiO2 on BiPAP.   CT of the chest and abdomen and pelvis was  done because of complaint of some abdominal versus chest discomfort. Impression: Cardiomegaly, coronary artery vascular disease. Small effusion, atelectasis versus infiltrate within lung bases. Cyst within kidney. Trace amount of fluid in pelvis. No evidence of obstructive or inflammatory abnormality.   CT of the head also done for confusion, which shows chronic involution changes without evidence of acute abnormalities.   ASSESSMENT AND PLAN: An 81 year old male with multiple past medical histories, presented with acute shortness of breath and confusion and found having CO2 retention in the ER. He is being admitted for congestive heart failure exacerbation to critical care unit.  1.  Congestive heart failure exacerbation, acute systolic heart failure. We will give him IV Lasix and continue on BiPAP, and cardiology consult with Dr. Mariah MillingGollan. We will follow troponin every 8 hourly for any acute coronary event, though it is less likely.  2. Acute respiratory failure, hypercapnic respiratory failure and needing oxygen supplementation. We will continue BiPAP support for now, and we will treat underlying cause.  3.  Atrial fibrillation, currently in sinus rhythm status post automatic implantable cardiac defibrillator. He is taking amiodarone and carvedilol at home. We will hold it currently because of borderline blood pressure and need of high dose of Lasix, which might drop blood pressure further.  4.  Asthma versus chronic obstructive pulmonary disease. He is using inhalers at home. We will continue the same, and because of CO2 retention and respiratory failure, we will give IV steroid and nebulizer treatment. Also, we can taper it down very fast if he improves. He does not have an elevated white cell count or any evidence of infection, so at this time, I would not start on antibiotic. We may start it later on.   5.  Coronary artery disease status post coronary artery bypass graft. We will continue aspirin. Beta  blocker is on hold because of borderline blood pressure.   Condition is critical because of acute respiratory failure and need of BiPAP, and presented with confusion also. We will monitor him in critical care unit.   Total time spent in explaining the plan with his sister, 60 minutes critical care.      ____________________________ Hope PigeonVaibhavkumar G. Elisabeth PigeonVachhani, MD vgv:dmm D: 10/02/2013 17:36:56 ET T: 10/02/2013 20:18:30 ET JOB#: [id number]  cc: Hope PigeonVaibhavkumar G. Elisabeth PigeonVachhani, MD, <Dictator> Antonieta Ibaimothy J. Gollan, MD Altamese DillingVAIBHAVKUMAR Tametria Aho MD ELECTRONICALLY SIGNED 10/06/2013 23:07
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
## 2015-04-16 NOTE — Consult Note (Signed)
General Aspect [name] is a 81yo African-American gentleman who recently moved to [facility] from [place] with a PMHx s/f chronic unspecified CHF, CAD, atrial fibrillation, AAA, HTN, asthma and BPH who was admitted to [facility] yesterday for acute on chronic CHF after running out of his home diuretics.  The patient is a poor historian. He reports losing his medications on a connection flight from [place] to [place]. He has developed DOE progressing to rest dyspnea, LE edema, progressive weight gain (baseline weight 195-200 lbs per patient), PND and orthopnea. He notes chest pressure the date of admission. He tells me he has a history of a pacemaker. He states he is on a water pill, but cannot tell me which one, the dose or how many. He states he has been placed on blood thinners such as Coumadin. Cannot provide details beyond that. He lives with his sister.   Present Illness In the ED, EKG revealed NSR, LAD, inferior Q waves, RBBB, poor R wave progression. Initial trop-I WNL. pBNP markedly elevated at [address]. BUN 39/Cr 1.44. CBC reveals a normocytic anemia- Hgb 11.5/Hct 36, otherwise unremarkable. CXR with findings suggestive of CHF. He was admitted by the medicine service and started on IV Lasix. A subsequent troponin and TSH returned WNL. Renal function worse this AM- BUN 36/Cr 1.54. I/O suggest mimimal UOP. Weight change yesterday 231->225.5.  PAST MEDICAL HISTORY: Hypertension, atrial fibrillation, congestive heart failure, coronary artery disease, asthma, abdominal aortic aneurysm, benign prostatic hypertrophy.   PAST SURGICAL HISTORY:  Cardiac cath and pacemaker placement.   FAMILY HISTORY:  Hypertension, coronary artery disease.   SOCIAL HISTORY: The patient does not smoke. No alcohol. No illicit drugs. Moved to [place] from [place] recently.   ALLERGIES:  No known drug allergies.   Physical Exam:  GEN no acute distress, obese   HEENT pink conjunctivae, PERRL, hearing intact to voice    NECK supple  No masses  trachea midline  JVP 8-9 cm   RESP bibasilar rales, trace inspiratory wheezing, no rhonchi   CARD Regular rate and rhythm  Normal, S1, S2  No murmur   ABD soft  normal BS  abdominal distention   EXTR negative cyanosis/clubbing, 2-3+ bilateral LE edema extending to scrotum   SKIN normal to palpation, No rashes, No ulcers   NEURO follows commands, motor/sensory function intact   PSYCH alert, poor insight   Review of Systems:  Subjective/Chief Complaint shortness of breath   General: Fatigue   Respiratory: Short of breath  Wheezing   Cardiovascular: Tightness   Review of Systems: All other systems were reviewed and found to be negative   Home Medications: Medication Instructions Status  Advair HFA CFC free 115 mcg-21 mcg/inh inhalation aerosol 2 puff(s) inhaled 2 times a day Active  acetaminophen-HYDROcodone 325 mg-7.5 mg oral tablet 1 tab(s) orally every 6 hours, As Needed - for Pain Active  carisoprodol 350 mg oral tablet 1 tab(s) orally 3 times a day Active  traMADol 50 mg oral tablet 1 tab(s) orally 2 times a day Active  brimonidine ophthalmic 0.15% ophthalmic solution 1 drop(s) to each eye 2 times a day Active  latanoprost ophthalmic 0.005% ophthalmic solution 1 drop(s) to each eye once a day (at bedtime) Active  metolazone 5 mg oral tablet 1 tab(s) orally 2 times a day Active  tamsulosin 0.4 mg oral capsule 1 cap(s) orally once a day Active  ProAir HFA CFC free 90 mcg/inh inhalation aerosol 2 puff(s) inhaled 4 times a day Active  Aspirin Enteric Coated  81 mg oral delayed release tablet 1 tab(s) orally once a day Active   Lab Results:  Thyroid:  17-Jul-14 01:04   Thyroid Stimulating Hormone 0.816 (0.45-4.50 (International Unit)  ----------------------- Pregnant patients have  different reference  ranges for TSH:  - - - - - - - - - -  Pregnant, first trimetser:  0.36 - 2.50 uIU/mL)  Routine Chem:  16-Jul-14 15:20   BUN -  Creatinine  (comp) -  B-Type Natriuretic Peptide ([facility]) - (Result(s) reported on 09 Jul 2013 at 03:58PM.)    17:04   BUN  39  Creatinine (comp)  1.44  B-Type Natriuretic Peptide [facility])  11556  17-Jul-14 01:04   BUN  36  Creatinine (comp)  1.54  Cardiac:  16-Jul-14 15:20   Troponin I - (0.00-0.05 0.05 ng/mL or less: NEGATIVE  Repeat testing in 3-6 hrs  if clinically indicated. >0.05 ng/mL: POTENTIAL  MYOCARDIAL INJURY. Repeat  testing in 3-6 hrs if  clinically indicated. NOTE: An increase or decrease  of 30% or more on serial  testing suggests a  clinically important change)  CK, Total -  CPK-MB, Serum - (Result(s) reported on 09 Jul 2013 at 03:58PM.)    17:04   Troponin I 0.04 (0.00-0.05 0.05 ng/mL or less: NEGATIVE  Repeat testing in 3-6 hrs  if clinically indicated. >0.05 ng/mL: POTENTIAL  MYOCARDIAL INJURY. Repeat  testing in 3-6 hrs if  clinically indicated. NOTE: An increase or decrease  of 30% or more on serial  testing suggests a  clinically important change)  CK, Total 169  CPK-MB, Serum  8.6 (Result(s) reported on 09 Jul 2013 at 05:26PM.)  17-Jul-14 01:04   Troponin I 0.04 (0.00-0.05 0.05 ng/mL or less: NEGATIVE  Repeat testing in 3-6 hrs  if clinically indicated. >0.05 ng/mL: POTENTIAL  MYOCARDIAL INJURY. Repeat  testing in 3-6 hrs if  clinically indicated. NOTE: An increase or decrease  of 30% or more on serial  testing suggests a  clinically important change)  CK, Total 114  CPK-MB, Serum  5.6 (Result(s) reported on 10 Jul 2013 at 01:47AM.)   Radiology Results: XRay:    16-Jul-14 15:21, Chest Portable Single View  Chest Portable Single View   REASON FOR EXAM:    Shortness of Breath  COMMENTS:       PROCEDURE: DXR - DXR PORTABLE CHEST SINGLE VIEW  - Jul 09 2013  3:21PM     RESULT: The lungs are mildly hypoinflated. The cardiac silhouette is   enlarged. The central pulmonary vascularity is prominent. Mildly   increased interstitial markings at the lung  bases are noted. A permanent   pacemaker-defibrillator is in place.    IMPRESSION:  The findings are consistent with low-grade CHF. There may be   minimal bibasilar atelectasis. A followup PA and lateral chest x-ray with   deep inspiration would be of value.     Dictation Site: 2    Verified By: DAVID A. Swaziland, M.D., MD    [phone] 08:33, Chest PA and Lateral  Chest PA and Lateral   REASON FOR EXAM:    hypoxia. CHF  COMMENTS:       PROCEDURE: DXR - DXR CHEST PA (OR AP) AND LATERAL  - Jul 10 2013  8:33AM     RESULT: Comparison is made to the study of July 09, 2013.    The lungs are mildly hypoinflated. The cardiac silhouette remains   enlarged. The posterior costophrenic angles remain blunted. The pulmonary   vascularity is  prominent centrally. A permanent pacemaker/defibrillator   is in place.    IMPRESSION:  The findings are consistent with congestive heart failure   with small bilateral pleural effusions layering posteriorly. There is no   focal pneumonia.   Dictation Site: 2        Verified By: DAVID A. SwazilandJORDAN, M.D., MD    No Known Allergies:   Vital Signs/Nurse's Notes: **Vital Signs.:   17-Jul-14 07:33  Vital Signs Type Q 4hr  Temperature Temperature (F) 98.4  Celsius 36.8  Temperature Source oral  Pulse Pulse 86  Respirations Respirations 22  Systolic BP Systolic BP 115  Diastolic BP (mmHg) Diastolic BP (mmHg) 77  Mean BP 89  Pulse Ox % Pulse Ox % 100  Pulse Ox Activity Level  At rest  Oxygen Delivery 3.5l  *Intake and Output.:   Daily 17-Jul-14 07:00  Grand Totals Intake:  720 Output:  750    Net:  -30 24 Hr.:  -30  Oral Intake      In:  720  Urine ml     Out:  750  Length of Stay Totals Intake:  720 Output:  750    Net:  -30    Impression 82yo African-American gentleman who recently [place] with a PMHx s/f chronic unspecified CHF, CAD, atrial fibrillation, AAA, HTN, asthma and BPH who was [facility] yesterday  for acute on chronic CHF after running out of his home diuretics.  1. Acute respiratory failure  Multifactorial- A/C CHF and asthma. Resolved.   2. Acute on chronic unspecified CHF Progressive and exacerbated by diuretic withdrawal. Weight change and patient's endorsement of frequent urination suggest good diuresis, I/O accurate? Agree with continued aggressive diuresis. -- Increase Lasix to 60 IV BID -- Strict I/Os, daily weights -- Fluid restrict < 1.5 L, sodium restrict < 2 g -- Add compression stockings and elevate legs -- Serial BMETs to follow renal function, electrolytes -- Review 2D echo for further qualification and to guide medical therapy/ischemic work-up  3. Atrial fibrillation NSR on admission EKG. Sinus overnight on telemetry. Patient tells me he is on a blood thinner such as Coumadin. Cannot clarify if he has atrial fibrillation. Would like to obtain records from prior PCP/cardiologist in [place] to further clarify.  -- Continue to monitor rhythm on telemetry   4. Acute renal insufficiency Cr 1.44->1.54 today. Cardiorenal.  -- Continue diuresis, follow output and renal function with serial BMETs  5. CAD  Patient reports a prior heart attack, but no stenting or bypass grafts. He does note chest pressure intermittently the date of admission. Trop-I WNL x 2. Obtain one more troponin for formal rule out.  -- Pursue stress testing as an outpatient   6. HTN Well-controlled.   7. Asthma Breathing treatments PRN. Keep on eye on rhythm with albuterol. If develops a-fib, consider Xopenex.   Electronic Signatures for Addendum Section:  Lorine BearsArida, Muhammad (MD) (Signed Addendum 17-Jul-14 17:18)  The patient was seen and examined. He is still significantly fluid overloaded. Echo showed an EF of 25% with biventricular failure and significant mitral/tricuspid regurgitation.  We increase Lasix to 60 mg bid. Continue Carvedilol. Will likely start a combination of Imdur/hydralazine if BP  tolerates.   Electronic Signatures: Odella AquasArguello, Matei Magnone A (PA-C)  (Signed 17-Jul-14 10:16)  Authored: General Aspect/Present Illness, History and Physical Exam, Review of System, Home Medications, Labs, Radiology, Allergies, Vital Signs/Nurse's Notes, Impression/Plan Lorine BearsArida, Muhammad (MD)  (Signed 17-Jul-14 17:18)  Co-Signer: General Aspect/Present Illness, Home  Medications, Allergies   Last Updated: 17-Jul-14 17:18 by Lorine Bears (MD)
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
## 2015-04-17 NOTE — Discharge Summary (Signed)
PATIENT NAME:  [name], [name] MR#:  [id number] DATE OF BIRTH:  [birth year]  Addendum   DATE OF ADMISSION:  04/30/2014 DATE OF DISCHARGE:  05/12/2014   ADMITTING PHYSICIAN: Katharina Caperima Vaickute, MD  DISCHARGING PHYSICIAN: Enid Baasadhika Wes Lezotte, MD  PRIMARY CARE PHYSICIAN: None.   PRIMARY CARDIOLOGIST: Antonieta Ibaimothy J. Gollan, MD, [facility].   CONSULTATIONS IN THE HOSPITAL:  1. Cardiology consultation by Dr. Kirke CorinArida and Dr. Mariah MillingGollan.  2. Neurology consultation by Dr. Raynelle Charyobert Hendry. 3. Nephrology consultation with Dr. Mady HaagensenMunsoor Lateef and Dr. Thedore MinsSingh. 4. Orthopedic consultation by Dr. Martha ClanKrasinski. 5. Rheumatology consultation by Dr. Gavin PottersKernodle.   DISCHARGE DIAGNOSES:  1. Acute on chronic respiratory failure, on 3 liters home oxygen.  2. Acute on chronic systolic congestive heart failure exacerbation, ejection fraction of 20%.  3. Acute cerebrovascular accident on admission with right-sided weakness.  4. Chronic obstructive pulmonary disease exacerbation.  5. Hypercarbic respiratory failure.  6. Pneumonia.  7. Hypertension.  8. Polyarticular inflammatory non-gouty arthritis.  9. Glaucoma.  10. Acute renal failure.  11. Chronic kidney disease.  12. Chronic paroxysmal atrial fibrillation.   DISCHARGE HOME MEDICATIONS:  1. Aspirin 81 mg p.o. daily.  2. Flomax 0.4 mg p.o. daily.  3. Latanoprost 0.005% ophthalmic solution 1 drop in each eye once a day at bedtime.  4. Brimonidine ophthalmic solution 0.15% 1 drop in each eye twice a day.  5. Advair 2 puffs b.i.d.  6. Coreg 3.125 mg p.o. b.i.d.  7. Amiodarone 200 mg b.i.d. 8. Spiriva 18 mcg inhalation capsule daily.  9. Norco 7.5/325 mg 1 tablet q.8 hours p.r.n. for pain.  10. Prednisone taper.  11. Eliquis 5 mg p.o. b.i.d.  12. DuoNebs 3 mL q.6 hours p.r.n. for wheezing.  13. Acetazolamide 250 mg 4 times a day.  14. Lasix 20 mg p.o. daily.   DISCHARGE HOME OXYGEN: 3 liters.   DISCHARGE DIET: Low-sodium, mechanical soft diet with thin liquids and  aspiration precautions. Medications in puree.   DISCHARGE ACTIVITY: As tolerated.    FOLLOWUP INSTRUCTIONS:  1. PCP followup in 1 week.  2. Physical therapy.  3. Cardiology followup in 2 weeks.  4. Nephrology followup in 2 weeks.  5. Wear CPAP at nighttime.   LABS AND IMAGING STUDIES PRIOR TO DISCHARGE:  ABG done on 05/12/2014 showing pH of 7.47, pO2 44, pCO2 50, bicarbonate of 36.4, saturations of 99% on BiPAP with FiO2 of 28%, 2 liters.  WBC is 11.0, hemoglobin 11.6, hematocrit 35.8, platelet count 159.  Sodium 132, potassium 3.8, chloride 91, bicarbonate 39, BUN 39, creatinine 1.46, glucose 256 and calcium of 8.9.  Chest x-ray revealing right lower lobe pneumonia or atelectasis.  BRIEF HOSPITAL COURSE: For more details, look at the history and physical dictated by Dr. Winona LegatoVaickute on 04/30/2014 and interim summary dictated by Dr. Cherlynn KaiserSainani on 05/05/2014 and also interim summary just dictated by Dr. Elisabeth PigeonVachhani on 05/11/2014. In short, [name] is an 81 year old African-American male who was admitted for acute on chronic congestive heart failure along with pneumonia and also COPD exacerbation. Was treated appropriately with antibiotics, steroids and Lasix.   He was also noted to have acute CVA with right-sided weakness; however, because of his pacemaker, MRI could not be done, and neurology suspected stroke, so because of his history of paroxysmal atrial fibrillation, he is started on Eliquis. He has only minimal right-sided weakness and will need therapy for that.   Polyarticular inflammatory non-gouty arthritis. He had right knee swelling for which arthroscopic tap was done, and fluids were showing pseudogout changes.  He had steroid injections in the wrist and also in knee.   The patient was supposed to be discharged on 05/11/2014; however, was more lethargic and was having shortness of breath. ABG showed hypercarbic respiratory failure, so he was started on BiPAP, with much improvement in his  gas this morning. He is chronically on 3 liters home O2, which will be continued, and he will need CPAP at nighttime. His course has been otherwise uneventful since yesterday.   DISCHARGE CONDITION: Stable.  DISCHARGE DISPOSITION: [employer].   ADDITIONAL TIME SPENT ON DISCHARGE: 40 minutes.   ____________________________ Enid Baas, MD rk:lb D: 05/12/2014 11:21:06 ET T: 05/12/2014 11:35:02 ET JOB#: [id number]  cc: Enid Baas, MD, <Dictator> Enid Baas MD ELECTRONICALLY SIGNED 05/21/2014 12:24

## 2015-04-17 NOTE — Consult Note (Signed)
Brief Consult Note: Diagnosis: Right knee effusion secondary to gout or osteoarthritis.   Patient was seen by consultant.   Consult note dictated.   Discussed with Attending MD.   Comments: Discussed with Dr. Cherlynn KaiserSainani.  Patient with large recurrent knee effusion.  Has had multiple prior aspirations by Dedra Skeensodd Mundy according to patient's wife.  I aspirated approximately 250 cc of straw colored fluid out of the patient's right knee.  Fluid appeared clear.  No purulence.  Compressive dressing applied to right knee.  Fluid sent for gram stain, culture, cell count and crystals.  I personally delivered the fluid to the lab and entered the orders.  May proceed with PT evaluation.  Patient has right sided hemiparesis.  Electronic Signatures: Juanell FairlyKrasinski, Raeli Wiens (MD)  (Signed 09-May-15 16:50)  Authored: Brief Consult Note   Last Updated: 09-May-15 16:50 by Juanell FairlyKrasinski, Vernona Peake (MD)
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
## 2015-04-17 NOTE — Consult Note (Signed)
PATIENT NAME:  [name], [name] MR#:  [id number] DATE OF BIRTH:  May 12, 1930  DATE OF CONSULTATION:  05/04/2014  REFERRING PHYSICIAN:   CONSULTING PHYSICIAN:  Dessie ComaGeorge Wallace Kernodle Jr., MD  REASON FOR CONSULTATION:  Pseudogout.   HISTORY OF PRESENT ILLNESS: An 81 year old African American male who has a history of renal insufficiency. He is admitted with shortness of breath, questionable pneumonia. A month ago, he had pain in the toe and was seen by podiatry, had an injection, raised the question of gout. He is a poor historian. On direct questioning, whether he has had gout before, he said he was not sure though he has had aspiration and injection of the knee previously. He does not recall having been on allopurinol. During this hospitalization, his creatinine is about 2. His right knee was swollen and his right wrist. He had aspiration of his right knee showing pseudogout. The knee was bandaged, still had a good deal of swelling.  He has been given some IV steroid. Right wrist has been swollen. X-ray showed some chondrocalcinosis but also cystic changes in the distal wrist, some chondrocalcinosis across the second MCP. Left hand has not been swollen, left knee has not been swollen. He has not had significant swelling in his ankles, there have been no elbow tophi. His respiratory status is improving but he is still on oxygen. White count is 6000, hemoglobin 12, platelets are low at 107,000. Last creatinine at 2.1.   PAST MEDICAL HISTORY:  Asthma, chronic obstructive pulmonary disease, congestive heart failure, osteoarthritis, diabetes.   PAST SURGICAL HISTORY:  Cardiac stents, defibrillator placement, hallux valgus correction.   REVIEW OF SYSTEMS: Cough and shortness of breath. Otherwise negative.   SOCIAL HISTORY: No significant alcohol.   FAMILY HISTORY: Negative family history of gout.   PHYSICAL EXAMINATION: VITAL SIGNS:  Respiratory rate 18, blood pressure 100/66, temperature 98, afebrile,  on 2 liters of oxygen.  HEENT: Sclerae noninjected, good carotid upstroke.  RESPIRATORY:  Distant lung sounds with mild wheeze.  CARDIAC:  No significant murmur.  ABDOMEN: Nontender.  EXTREMITIES: Have 1+ edema.  MUSCULOSKELETAL: Good range of motion of his neck and shoulders. He has a right wrist that is subluxed with thickening and some thickening and synovitis across the dorsum of the right hand, MCPs and PIPs without significant synovitis. No elbow nodules. Left hand without synovitis. Left knee with crepitus, right knee with large effusion and mild dropped arch. The valgus bilaterally without synovitis or tophi.   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 Given the setting of renal insufficiency would also worry about hyperuricemia and gouty arthritis that has been chronic, arthropathy of the right wrist with recent crystalline flare.   PLAN: Right wrist splint.  We will hold on injection.   PROCEDURE: The right knee was prepped in a sterile manner. I aspirated 100 mL of serosanguineous fluid, inflammatory; injected with 1 mL of Kenalog.   I agree with steroid taper down the road and later as an outpatient might consider having his right wrist injected. If he has other bouts of arthropathy, may have to be on a regimen to lower his  uric acid in the setting of renal insufficiency.    ____________________________ Dessie ComaGeorge Wallace Kernodle Jr., MD gwk:cs D: 05/04/2014 18:05:20 ET T: 05/04/2014 19:49:11 ET JOB#: [id number]  cc: Dessie ComaGeorge Wallace Kernodle Jr., MD, <Dictator> Webb SilversmithGEORGE W KERNODLE J MD ELECTRONICALLY SIGNED 05/05/2014 18:14

## 2015-04-17 NOTE — Consult Note (Signed)
Brief Consult Note: Diagnosis: Abnormal UPEP.   Comments: Awaiting IFE results. CReatinine improving. Will follow as oupaptient as this not likely to impact current acute issues.  Electronic Signatures: Antony Hasteamiah, Jumar Greenstreet S (MD)  (Signed 15-May-15 15:01)  Authored: Brief Consult Note   Last Updated: 15-May-15 15:01 by Antony Hasteamiah, Laelani Vasko S (MD)

## 2015-04-17 NOTE — Consult Note (Signed)
Chief Complaint:  Subjective/Chief Complaint He is mostly complaining of severe right knee pain with a very swollen right knee.  He currently denies any dyspnea.   VITAL SIGNS/ANCILLARY NOTES: **Vital Signs.:   09-May-15 11:00  Vital Signs Type Routine  Temperature Temperature (F) 98.2  Celsius 36.7  Temperature Source oral  Pulse Pulse 51  Respirations Respirations 20  Systolic BP Systolic BP 517  Diastolic BP (mmHg) Diastolic BP (mmHg) 68  Mean BP 85  Pulse Ox % Pulse Ox % 95  Pulse Ox Activity Level  At rest  Oxygen Delivery 3L  *Intake and Output.:   09-May-15 02:00  Grand Totals Intake:   Output:  400    Net:  -49 24 Hr.:  -4282.1  Urine ml     Out:  400  Urinary Method  Urinal    04:44  Grand Totals Intake:  139.8 Output:  375    Net:  -235.2 24 Hr.:  -4517.3  IV (Primary)      In:  139.8  Urine ml     Out:  375  Urinary Method  Urinal    06:21  Grand Totals Intake:   Output:      Net:   24 Hr.:  -4517.3  Weight Type daily  Weight Method Bed  Current Weight (lbs) (lbs) 189.1  Current Weight (kg) (kg) 85.8  Height (ft) (feet) 6  Height (in) (in) 0  Height (cm) centimeters 182.8  BSA (m2) 2  BMI (kg/m2) 25.6    Shift 07:00  Grand Totals Intake:  236.8 Output:  1100    Net:  -863.2 24 Hr.:  -4517.3  IV (Primary)      In:  236.8  Urine ml     Out:  1100  Length of Stay Totals Intake:  1125.7 Output:  6975    Net:  -5849.3    Daily 07:00  Grand Totals Intake:  757.7 Output:  5275    Net:  -4517.3 24 Hr.:  -4517.3  Oral Intake      In:  474  Heparin      In:  26.9  IV (Primary)      In:  236.8  IV (Secondary)      In:  20  Urine ml     Out:  5275  Length of Stay Totals Intake:  1125.7 Output:  6975    Net:  -5849.3    08:00  Grand Totals Intake:  240 Output:      Net:  240 24 Hr.:  240  Oral Intake      In:  240  Percentage of Meal Eaten  100    08:09  Grand Totals Intake:   Output:  1100    Net:  -33 24 Hr.:  -860  Urine ml      Out:  1100  Urinary Method  Urinal    10:39  Grand Totals Intake:   Output:  0    Net:  0 24 Hr.:  -860  Urine ml     Out:  0    Shift 15:00  Grand Totals Intake:  240 Output:  1100    Net:  -860 24 Hr.:  -860  Oral Intake      In:  240  Urine ml     Out:  1100  Length of Stay Totals Intake:  1365.7 Output:  8075    Net:  -6709.3   Lab Results: Routine Chem:  09-May-15 05:43   Glucose,  Serum  108  BUN  44  Creatinine (comp)  2.64  Sodium, Serum  134  Potassium, Serum 3.8  Chloride, Serum  92  CO2, Serum  35  Calcium (Total), Serum 9.2  Anion Gap 7  Osmolality (calc) 280  eGFR (African American)  25  eGFR (Non-African American)  22 (eGFR values <85m/min/1.73 m2 may be an indication of chronic kidney disease (CKD). Calculated eGFR is useful in patients with stable renal function. The eGFR calculation will not be reliable in acutely ill patients when serum creatinine is changing rapidly. It is not useful in  patients on dialysis. The eGFR calculation may not be applicable to patients at the low and high extremes of body sizes, pregnant women, and vegetarians.)  Routine Coag:  09-May-15 05:43   Activated PTT (APTT)  48.0 (A HCT value >55% may artifactually increase the APTT. In one study, the increase was an average of 19%. Reference: "Effect on Routine and Special Coagulation Testing Values of Citrate Anticoagulant Adjustment in Patients with High HCT Values." American Journal of Clinical Pathology 2006;126:400-405.)   Assessment/Plan:  Assessment/Plan:  Assessment 1. Acute on chronic systolic heart failure: I agree with IV Lasix to continue.  His creat is stable despite the urine output.  Switch to PO Lasix in the AM.    2. Pneumonia and respiratory failure:  Per primary team.    3. Acute on chronic kidney disease: Creat stable.    4. CAD:  Mild troponin elevation probably secondary.  No in patient ischemia work up is planned.   5. Questionable  history of atrial fibrillation: Maintaining NSR.  Continue amiodarone.   Electronic Signatures: HMinus Breeding(MD)  (Signed 09-May-15 14:02)  Authored: Chief Complaint, VITAL SIGNS/ANCILLARY NOTES, Lab Results, Assessment/Plan   Last Updated: 09-May-15 14:02 by HMinus Breeding(MD)

## 2015-04-17 NOTE — H&P (Signed)
PATIENT NAME:  [name], [name] MR#:  [id number] DATE OF BIRTH:  1930-08-19  DATE OF ADMISSION:  04/30/2014  PRIMARY CARE PHYSICIAN:  None.  CARDIOLOGIST: Dr. Mariah MillingGollan at [facility].   The patient is an 81 year old African American male with past medical significant for history of chronic systolic CHF with severe mitral regurg, as well as tricuspid regurg, history of asthma, hypertension, Afib, coronary artery disease, diabetes mellitus, recent diagnosis; who presents to the hospital with weakness, as well as shortness of breath. Unfortunately, no history is available from the patient himself, as the patient is obtunded during my evaluation, but according to the Emergency Room physician, the patient presented with weakness, shortness of breath, as well as hypoxia. His O2 sats were found to be in the 80s on room air by EMS. Upon arrival to the Emergency Room, the patient had ABGs done, which revealed pH of 7.32, pCO2 was in  60s and pO2 was also in the 60s and his BNP was high at 20,000. He also had patchy airspace disease on chest x-ray, questionable CHF or pneumonia. Hospitalist services were contacted for admission. The patient was also noted to have acute on chronic renal failure. The patient, again, is not able to provide much more history. He was given Levaquin as antibiotic and he had some reaction to this antibiotic, so he was given Benadryl and became obtunded after Benadryl was given.   PAST MEDICAL HISTORY: Significant for history of admission in October 2014 for acute systolic CHF. Echocardiogram done in July 2014 showed ejection fraction of 20% to 25%, severely decreased global left ventricular function, mild concentric left ventricular hypertrophy.  Also pseudo- normal pattern of left ventricular diastolic filling, severely reduced right ventricular systolic function, severe tricuspid as well as mitral regurgitation, moderate mitral annular calcification, mild aortic valve sclerosis without  stenosis and moderate aortic regurgitation and elevated right ventricular pressures; history of asthma, hypertension, weakness, atrial fibrillation, coronary artery disease, diabetes mellitus type 2, AAA, hypertension, history of BPH.   PAST SURGICAL HISTORY: Cardiac cath as well as pacemaker placement.   FAMILY HISTORY: Hypertension, coronary artery disease, per medical records.   SOCIAL HISTORY: Does not smoke. No alcohol. No illicit drugs. [place] Follows with  Dr. Mariah MillingGollan.  Lives with his sister who takes care of him.   MEDICATIONS: According to medical records, the patient is on acetaminophen/hydrocodone 325/7.5 mg every 6 hours as needed, Advair HFA 115 mcg/21 mcg 2 puffs twice daily, amiodarone 200 mg twice daily, aspirin 81 mg p.o. daily, brimonidine ophthalmic solution 0.15% twice daily, carisoprodol 350 mg p.o. 3 times daily, carvedilol 3.125 mg twice daily, chlorpheniramine/hydrocodone 8/10 in 5 mL solution, 5 to 6 twice daily, latanoprost ophthalmic solution 0.005% solution 1 drop to each eye at bedtime, Levaquin 750 mg p.o. daily, this medication was started in January 2015; I am not sure if he is still taking this.  Lisinopril 2.5 mg daily, ProAir HFA 2 puffs every 4 hours 4 times daily, tamsulosin 0.4 mg daily, tiotropium 18 mcg inhalation daily and torsemide 1 capsule which would be 10 mg twice daily. It is unclear  which medications he is still taking; however, according to home medication review, status  was complete.   REVIEW OF SYSTEMS:  Not available as the patient is obtunded.   PHYSICAL EXAMINATION:  VITAL SIGNS:  On arrival to the Emergency Room, the patient's temperature was 98.6. Pulse was 89. Respiratory rate was 22.  Blood pressure 123/75, saturation was 93% on oxygen  therapy.  GENERAL: This is a well-developed, well-nourished African American male in moderate to severe respiratory distress, sitting on the stretcher. He is poorly  responsive. He is drooling.   HEENT: His pupils are equal, reactive to light.  Extraocular movements intact.  No conjunctivitis. Normal hearing. He is able to respond briefly, opens his eyes and tries to mumble, however, not discernibly.  NECK:  No masses. Supple, nontender. Thyroid is not enlarged. No adenopathy. No JVD or carotid bruits bilaterally.  Full range of motion.  LUNGS:  Rales on the right side, a few rhonchi, diminished breath sounds bilaterally, but no wheezing. The patient does have labored inspirations, as well as increased effort to breathe. He is in respiratory distress.  CARDIOVASCULAR: S1, S2 appreciated. No murmurs, rubs or gallops.  Heart sounds are distant.  PMI is not lateralized. Chest is nontender to palpation. 1+ pedal pulses.   The patient's lower extremity edema is significant.  Induration is  significant, at least 4+.   Not able to assess him for calf tenderness or cyanosis.  ABDOMEN: Soft, nontender. Bowel sounds are present. No hepatosplenomegaly or masses were noted. Some discomfort on palpation of his suprapubic area. No voluntary guarding.  MUSCLE STRENGTH: Not able to move all extremities. The patient is poorly responsive. No cyanosis or degenerative joint disease. Not able to assess for kyphosis. Gait is not tested.  SKIN: Did not reveal any rashes, lesions, erythema, nodularity. The patient does have severe induration of his lower extremities. Skin was warm and dry to palpation.  LYMPHATIC: No adenopathy in the cervical region.  NEUROLOGICAL: Cranial nerves grossly intact, however, very  difficult to evaluate him. Now, he is a little bit more awake after BiPAP is on for a few minutes. He is able to open his eyes, and he is responding to stimuli. He has some dysarthria.  The patient was extremely somnolent, poorly waking up, now is a little bit more cooperative after BiPAP is initiated. Not able to assess his memory.   LABORATORY DATA:  BMP showed glucose of 189, BUN and creatinine were 39 and  2.73, as opposed to 35 and 1.4 in January 2015.  The patient's sodium 132, potassium 3.4. The patient's beta-natriuretic peptide was 20,714. Liver enzymes revealed AST elevation to 109. Troponin is elevated to 0.07. White blood cell count is normal at 5.7, hemoglobin 11.7, platelet count 108, absolute neutrophil count is not checked.  Urinalysis: Amber, cloudy urine. Negative for glucose, bilirubin or ketones. Specific gravity was 1.024, pH was 5.0, 3+ blood, more than 500 of protein, negative for nitrites or leukocyte esterase, 8 red blood cells, 26 white blood cells, no bacteria was seen, epithelial cells 1 and mucus was present. ABGs were performed on 28% FiO2: PH was 7.32, pCO2 of 63, pO2 of 62, saturation 93.2% with lactic acid level of 1.3. EKG showed sinus rhythm with premature atrial complexes with aberrant conduction at 88 beats per minute. Left axis deviation, nonspecific intraventricular block, nonspecific ST-T changes. The patient does have a T depression in high lateral leads. As compared to prior EKG done in January 2015, no significant change since prior EKG.   ASSESSMENT AND PLAN: 1. Acute hypoxia, hypercarbia, respiratory failure due to congestive heart failure, as well as likely pneumonia. Admit the patient to the medical floor to critical care unit. Continue BiPAP for now. Follow his mental status and ABGs if needed.  2.  Altered mental status due to Benadryl, as well as hypercarbia, seems to be improving now  on BiPAP.  3.  Acute pulmonary edema. We will continue the patient on Lasix IV. Repeat a chest x-ray in the morning.  4.  Questionable pneumonia. We will continue the patient on Levaquin IV since the patient is not able to take p.o.  We may need to add to Zosyn.  6.  Acute on chronic renal failure. Follow with Lasix. We will get bladder scan and we will hold nephrotoxic medications.  We will get nephrologist involved for further recommendations. We will get ultrasound of kidneys to  rule out obstruction.   TIME SPENT:  One hour on the patient.    ____________________________ Katharina Caper, MD rv:dmm D: 04/30/2014 17:46:00 ET T: 04/30/2014 20:03:52 ET JOB#: [id number]  cc: Katharina Caper, MD, <Dictator> Tonika Eden MD ELECTRONICALLY SIGNED 06/17/2014 13:19

## 2015-04-17 NOTE — Consult Note (Signed)
Brief Consult Note: Comments: rt knee aspirated 100 c inflammatory fluid, injected with 1 c kenalog. will splint rt wrist.  Electronic Signatures: Royann ShiversKernodle, Jr., Helen HashimotoGeorge Wallace (MD)  (Signed 11-May-15 17:59)  Authored: Brief Consult Note   Last Updated: 11-May-15 17:59 by Royann ShiversKernodle, Jr., Helen HashimotoGeorge Wallace (MD)

## 2015-04-17 NOTE — Consult Note (Signed)
Chief Complaint:  Subjective/Chief Complaint Today his knee is better but he is very uncomfortable with his right had.  He has been diagnosed with pseudogout.  He does report some dyspnea but he is not in acute distress   VITAL SIGNS/ANCILLARY NOTES: **Vital Signs.:   10-May-15 12:00  Vital Signs Type Routine  Temperature Temperature (F) 98.1  Celsius 36.7  Pulse Pulse 71  Respirations Respirations 20  Systolic BP Systolic BP 615  Diastolic BP (mmHg) Diastolic BP (mmHg) 78  Mean BP 98  Pulse Ox % Pulse Ox % 97  Pulse Ox Activity Level  At rest  Oxygen Delivery 3L  *Intake and Output.:   10-May-15 05:05  Weight Method Bed    Daily 07:00  Grand Totals Intake:  340 Output:  2000    Net:  -3794 32 Hr.:  -1660  Oral Intake      In:  240  IV (Secondary)      In:  100  Urine ml     Out:  2000  Length of Stay Totals Intake:  1465.7 Output:  8975    Net:  -7509.3   Brief Assessment:  GEN disheveled   Cardiac Regular  + LE edema  -- JVD  distant heart sounds   Respiratory decreased breath sounds   EXTR positive edema   Lab Results: Routine Chem:  10-May-15 05:44   Glucose, Serum  152  BUN  36  Creatinine (comp)  2.03  Sodium, Serum  132  Potassium, Serum  3.4  Chloride, Serum  89  CO2, Serum  38  Calcium (Total), Serum 9.7  Anion Gap  5  Osmolality (calc) 276  eGFR (African American)  35  eGFR (Non-African American)  30 (eGFR values <9m/min/1.73 m2 may be an indication of chronic kidney disease (CKD). Calculated eGFR is useful in patients with stable renal function. The eGFR calculation will not be reliable in acutely ill patients when serum creatinine is changing rapidly. It is not useful in  patients on dialysis. The eGFR calculation may not be applicable to patients at the low and high extremes of body sizes, pregnant women, and vegetarians.)   Assessment/Plan:  Assessment/Plan:  Plan 1. Acute on chronic systolic heart failure:  Changed to PO Lasix.     2. Pneumonia and respiratory failure:  Per primary team.    3. Acute on chronic kidney disease: Creat is going down.  Continue with current therapy.    4. CAD:  Mild troponin elevation probably secondary.  No in patient ischemia work up is planned.   5. Questionable history of atrial fibrillation: Maintaining NSR.  Continue amiodarone.   Electronic Signatures: HMinus Breeding(MD)  (Signed 10-May-15 14:10)  Authored: Chief Complaint, VITAL SIGNS/ANCILLARY NOTES, Brief Assessment, Lab Results, Assessment/Plan   Last Updated: 10-May-15 14:10 by HMinus Breeding(MD)

## 2015-04-17 NOTE — Consult Note (Signed)
PATIENT NAME:  [name], [name] MR#:  [id number] DATE OF BIRTH:  Dec 06, 1930  DATE OF CONSULTATION:  05/02/2014  REFERRING PHYSICIAN:   CONSULTING PHYSICIAN:  Kathreen DevoidKevin L. Darionna Banke, MD  REASON FOR CONSULTATION:  Right knee effusion.   HISTORY OF PRESENT ILLNESS:  [name] is an 81 year old male who has a significant past medical history which includes chronic systolic CHF with severe mitral regurgitation, tricuspid regurgitation and a history of atrial fibrillation and coronary artery disease with diabetes and a history of asthma and hypertension.  He presents to the hospital with weakness and shortness of breath.  The patient is being followed by cardiology, nephrology and neurology.  Today he is more alert and stable.  The neurologist felt that the patient likely has sustained a stroke which explains his weakness.  He is having right-sided upper and lower extremity weakness.  He was noted to have a large knee effusion and was complaining of knee pain today.   I am consulted for further evaluation and management of the right knee effusion.   The patient's past medical history, past surgical history, family and social history as well as medication, allergies were reviewed from his medical record and admission H and P.    PHYSICAL EXAMINATION:  RIGHT KNEE:  The patient has a large effusion.  The effusion is tense.  The patient has global tenderness around the right knee and it has difficulty flexing the knee without significant pain.   There is no open wounds or drainage from the knee.  He distally has palpable pedal pulses and intact sensation to light touch.  He can weakly flex and extend the toes.   RADIOLOGIC STUDIES:  A right knee series was performed today.  This demonstrates significant tricompartmental osteoarthritis with significant joint space narrowing in the lateral compartment with marginal osteophyte formation, subchondral sclerosis and cyst formation.   ASSESSMENT:  Right knee effusion in  the setting of severe tricompartmental osteoarthritis and a history of gout.   PLAN:  I explained to [name] that given the tense effusion of his right knee that we are going to aspirate this today.  He has had previous aspirations by Dr. Dedra Skeensodd Mundy according to the patient's wife.  I spoke to her on the phone while with the patient in the room today.  The patient agreed to have an aspirate performed at the bedside today.   PROCEDURE NOTE:  I prepped the lateral right knee with Betadine.  I injected 10 mL of 1% lidocaine plain into the knee with the knee in full extension.  This was given approximately five minutes to take effect.  The knee was then prepped with Betadine again.  Using a lateral mid subpatellar approach I aspirated nearly 250 mL of straw-colored fluid from the knee.  There is no evidence of purulence.  The knee fluid was clear.  Approximately 120 mL of fluid was sent to the lab for cell count, Gram stain, culture and sensitivity and crystals.  I personally walked the specimen to the lab today.  The patient is already on meropenem and antibiotics.  A compressive wrap was applied to the right knee in hopes of reducing the effusion's ability to reconstitute.  I will follow up with the lab results when they are available.  The patient's effusions are likely from osteoarthritis or possibly gout.  The labs should shed light on this.  I did not inject corticosteroid today as he is being treated with meropenem for an active infection.  He  may be a candidate for a corticosteroid injection on a later date once his acute infection has resolved.  In the meantime, NSAIDs or possibly indomethacin or colchicine can be used as treatment depending if the lab shows gout.    ____________________________ Kathreen Devoid, MD klk:ea D: 05/02/2014 19:10:03 ET T: 05/02/2014 22:30:49 ET JOB#: [id number]  cc: Kathreen Devoid, MD, <Dictator> Kathreen Devoid MD ELECTRONICALLY SIGNED 05/12/2014 7:53

## 2015-04-25 NOTE — Op Note (Signed)
PATIENT NAME:  [name], [name] MR#:  [id number] DATE OF BIRTH:  11-29-1930  DATE OF PROCEDURE:  12/29/2014  PREOPERATIVE DIAGNOSES: Atherosclerotic occlusive disease bilateral lower extremities with ulceration of the right foot.   POSTOPERATIVE DIAGNOSES: Atherosclerotic occlusive disease bilateral lower extremities with ulceration of the right foot.   PROCEDURES PERFORMED:  1.  Abdominal aortogram.  2.  Right lower extremity distal runoff third order catheter placement.  3.  Additional third order catheter placement.  4.  Crosser atherectomy with balloon angioplasty of the right peroneal artery.  5.  Crosser atherectomy with balloon angioplasty of the right anterior tibial artery.  6.  Closure using StarClose device.   PROCEDURE PERFORMED BY: Renford DillsGregory G. Schnier, MD   SEDATION: Versed plus fentanyl. Continuous ECG, pulse oximetry and cardiac cardiopulmonary monitoring was performed throughout the entire procedure by the interventional radiology nurse. Total sedation time was one hour, 30 minutes.   ACCESS: A 6 French sheath left common femoral artery.   FLUOROSCOPY TIME: 16.9 minutes.   CONTRAST USED: Isovue 110 mL.   INDICATIONS: Mr. Robert OaksMadden is an 81 year old gentleman who presented to the office with increasing pain as well as tissue loss on the right foot.  Physical examination as well as noninvasive studies demonstrated findings consistent with atherosclerotic occlusive disease. He was therefore recommended he undergo angiography with the hope for intervention to allow for wound healing and limb salvage. Risks and benefits have been reviewed. All questions answered. Alternative therapies were discussed. The patient agrees to proceed.   DESCRIPTION OF PROCEDURE: The patient is taken to special procedures, placed in the supine position, and after adequate sedation is achieved, both groins are prepped and draped in sterile fashion. Ultrasound is placed in a sterile sleeve. Right common  femoral artery is identified. It is echolucent and pulsatile indicating patency. Images recorded for the permanent record. Under real-time visualization, microneedle is inserted, microwire followed by microsheath, J-wire followed by a 5 French sheath and 5 French pigtail catheter. The pigtail catheter is positioned at T12 and AP projection of the aorta is obtained.   Pigtail catheter is repositioned to above the bifurcation and an LAO projection of the pelvis is obtained. Stiff angled Glidewire and pigtail catheter are then used to cross the bifurcation with the catheter in the distal external iliac on the right. Hand injection of contrast with an RAO projection of the common femoral artery demonstrates the femoral bifurcation. Wire and catheter are then negotiated into the SFA. Hand injection of contrast is then used to demonstrate distal runoff. Significant occlusion within the tibial vessels is identified.   Stiff angled Glidewire is reintroduced and a 6 [place]French Raabe sheath is advanced up and over the bifurcation and positioned with its tip in the SFA. Using the Glidewire and a straight Slip-Catheter, the wire and catheter are negotiated down into the distal popliteal where magnified images are obtained. The catheter is then negotiated into the tibioperoneal trunk where the proximal peroneal is occluded. The posterior tibial is nonvisualized throughout its entire course. V-18 wire is then introduced and the catheter is exchanged for an Usher catheter. Subsequently a Crosser S6 device is prepped on the table and delivered through the Usher catheter and negotiated into the peroneal. Crosser atherectomy is then performed to the distal one-third of the peroneal where the Usher catheter is tracked down along the Crosser.   Upon removal of the Crosser and hand injection, distal runoff is obtained. In the distal one-third there is greater than 75% narrowing over approximately  6 cm. This will be treated later with  a 2 x 8 balloon inflated to 10 atmospheres for approximately 2 minutes. The occluded portion of the peroneal located in its proximal one-third is initially angioplastied with a 2 mm x 20 cm balloon to 14 atmospheres and then subsequently a 3 mm x 12 cm long balloon again to approximately 14 atmospheres for approximately 2 minutes. Followup imaging after bolus angioplasties within the peroneal are done demonstrates patency with fairly good flow down to the foot filling the lateral plantar from a dominant collateral.   The catheter is then reintroduced over the V-18 wire and the wire catheter is negotiated into the anterior tibial. Anterior tibial occludes just past the curve as it then heads downward. This represents the additional third order catheter placement as a separate distinct tibial artery. Again, once the wire and catheter are in position down to the level of the occlusion in the anterior tibial, the catheter is removed and the Usher catheter is once again introduced. The Crosser catheter is then advanced through the Usher and used to cross the occlusion within the anterior tibial artery. Crosser catheter is negotiated down to the level just above the lateral malleolus. The Usher catheter is advanced to the hilt. Hand injection of contrast demonstrates patency of the anterior tibial at this level, and subsequently an 0.014 wire is introduced and a 3 x 30 balloon is advanced across the anterior tibial and angioplasty is performed. Again inflation is to 16 atmospheres for approximately 2 minutes. Followup imaging demonstrates patency of the anterior tibial throughout its course now.   The sheath is then pulled into the external on the left, oblique view was obtained and a StarClose device deployed. There are no immediate complications.   INTERPRETATION: Initial views of the aorta demonstrate the aorta and bilateral common and external iliac arteries are patent. There is diffuse disease but no  hemodynamically significant stenoses.   The common femoral and profunda femoris are patent as well. Superficial femoral popliteal are patent. The tibioperoneal trunk is patent in its proximal one half. It occludes distally and remains occluded into the proximal one third of the peroneal. There is also a high-grade critical stenosis more distally as described above. Distally upon hand injection within the peroneal it does show a large collateral extending to the lateral plantar filling the pedal arch. The collateral that is typically seen to the dorsalis pedis is absent. The posterior tibial is occluded throughout its entire course. Anterior tibial is occluded in its mid section for greater than half the distance; however, it does reconstitute distally and does fill a patent dorsalis pedis although somewhat diseased.   Following Crosser atherectomy and balloon angioplasty of the peroneal, it remains patent. Following Crosser atherectomy and angioplasty of the anterior tibial, it too is now patent with forward flow down to the foot.   SUMMARY: Successful recanalization of 2 out of the 3 right lower extremity tibial vessels for direct in-line blood flow to the foot for wound healing.   ____________________________ Renford Dills, MD ggs:AT D: 12/29/2014 17:58:46 ET T: 12/29/2014 23:49:35 ET JOB#: [id number]  cc: Renford Dills, MD, <Dictator> Teena Irani. Terance Hart, MD Renford Dills MD ELECTRONICALLY SIGNED 01/05/2015 17:33

## 2015-04-30 ENCOUNTER — Ambulatory Visit (INDEPENDENT_AMBULATORY_CARE_PROVIDER_SITE_OTHER): Payer: Medicare Other | Admitting: *Deleted

## 2015-04-30 DIAGNOSIS — I255 Ischemic cardiomyopathy: Secondary | ICD-10-CM

## 2015-04-30 DIAGNOSIS — I5022 Chronic systolic (congestive) heart failure: Secondary | ICD-10-CM

## 2015-04-30 DIAGNOSIS — I4729 Other ventricular tachycardia: Secondary | ICD-10-CM

## 2015-04-30 DIAGNOSIS — I480 Paroxysmal atrial fibrillation: Secondary | ICD-10-CM

## 2015-04-30 DIAGNOSIS — I472 Ventricular tachycardia: Secondary | ICD-10-CM | POA: Diagnosis not present

## 2015-04-30 LAB — CUP PACEART INCLINIC DEVICE CHECK
Brady Statistic AP VP Percent: 0.11 %
Brady Statistic AS VP Percent: 0.11 %
Brady Statistic AS VS Percent: 83.91 %
Date Time Interrogation Session: 20160506093438
HIGH POWER IMPEDANCE MEASURED VALUE: 36 Ohm
HIGH POWER IMPEDANCE MEASURED VALUE: 45 Ohm
Lead Channel Impedance Value: 352 Ohm
Lead Channel Pacing Threshold Amplitude: 1.5 V
Lead Channel Pacing Threshold Pulse Width: 0.4 ms
Lead Channel Pacing Threshold Pulse Width: 0.6 ms
Lead Channel Setting Pacing Amplitude: 2 V
Lead Channel Setting Pacing Amplitude: 2.5 V
Lead Channel Setting Sensing Sensitivity: 0.3 mV
MDC IDC MSMT BATTERY VOLTAGE: 2.9 V
MDC IDC MSMT LEADCHNL RA IMPEDANCE VALUE: 472 Ohm
MDC IDC MSMT LEADCHNL RA PACING THRESHOLD AMPLITUDE: 1 V
MDC IDC MSMT LEADCHNL RA SENSING INTR AMPL: 2.4 mV
MDC IDC MSMT LEADCHNL RV SENSING INTR AMPL: 19.3 mV
MDC IDC SET LEADCHNL RV PACING PULSEWIDTH: 0.6 ms
MDC IDC SET ZONE DETECTION INTERVAL: 280 ms
MDC IDC STAT BRADY AP VS PERCENT: 15.87 %
MDC IDC STAT BRADY RA PERCENT PACED: 15.97 %
MDC IDC STAT BRADY RV PERCENT PACED: 0.22 %
Zone Setting Detection Interval: 320 ms
Zone Setting Detection Interval: 350 ms
Zone Setting Detection Interval: 400 ms
Zone Setting Detection Interval: 450 ms

## 2015-04-30 NOTE — Progress Notes (Signed)
ICD check in clinic. Normal device function. Thresholds and sensing consistent with previous device measurements. Impedance trends stable over time. 847 NSVT---pt had a few short runs during interrogation also. Longest NSVT w/ EGM 7 beats. 1 mode switch---423min + eliquis. Histogram distribution appropriate for patient and level of activity. No changes made this session. Device programmed at appropriate safety margins. Device programmed to optimize intrinsic conduction. Remaining power 2.90V. ROV w/ SK/B 08/10/15.

## 2015-06-11 ENCOUNTER — Telehealth: Payer: Self-pay | Admitting: *Deleted

## 2015-06-11 NOTE — Telephone Encounter (Signed)
Attempted to make appt w/ pt per note from Dr. Graciela Husbands. Pt's listed number does not have voicemail. Phone continually rings.  Re: needs appt w/ Dr. Graciela Husbands in [facility] [facility] in [place]

## 2015-06-17 NOTE — Telephone Encounter (Signed)
2nd attempt to reach pt to make OV w/ Dr. Graciela Husbands. Phone continually rings w/out answer or voicemail.

## 2015-06-23 NOTE — Telephone Encounter (Signed)
Spoke w/ Megan at [facility]. Megan aware pt now has appt to [facility]. [facility] prior to pt's appt. Megan aware BMET needs to be completed prior to 06/29/15 appt.

## 2015-06-29 ENCOUNTER — Encounter: Payer: Self-pay | Admitting: Internal Medicine

## 2015-06-29 ENCOUNTER — Ambulatory Visit
Admission: RE | Admit: 2015-06-29 | Discharge: 2015-06-29 | Disposition: A | Discharge status: Home / Self Care | Payer: Medicare Other | Source: Ambulatory Visit | Attending: Internal Medicine | Admitting: Internal Medicine

## 2015-06-29 ENCOUNTER — Ambulatory Visit (INDEPENDENT_AMBULATORY_CARE_PROVIDER_SITE_OTHER): Payer: Medicare Other | Admitting: Internal Medicine

## 2015-06-29 VITALS — BP 90/58 | HR 72 | Ht 71.0 in | Wt 175.0 lb

## 2015-06-29 DIAGNOSIS — R05 Cough: Secondary | ICD-10-CM

## 2015-06-29 DIAGNOSIS — I517 Cardiomegaly: Secondary | ICD-10-CM | POA: Diagnosis not present

## 2015-06-29 DIAGNOSIS — I771 Stricture of artery: Secondary | ICD-10-CM | POA: Diagnosis not present

## 2015-06-29 DIAGNOSIS — I480 Paroxysmal atrial fibrillation: Secondary | ICD-10-CM

## 2015-06-29 DIAGNOSIS — R053 Chronic cough: Secondary | ICD-10-CM

## 2015-06-29 DIAGNOSIS — I5022 Chronic systolic (congestive) heart failure: Secondary | ICD-10-CM | POA: Diagnosis not present

## 2015-06-29 DIAGNOSIS — I700 Atherosclerosis of aorta: Secondary | ICD-10-CM | POA: Insufficient documentation

## 2015-06-29 LAB — CUP PACEART INCLINIC DEVICE CHECK
Battery Voltage: 2.88 V
Brady Statistic AP VP Percent: 0.06 %
Brady Statistic AS VS Percent: 91.36 %
Brady Statistic RA Percent Paced: 8.37 %
Date Time Interrogation Session: 20160705115236
HighPow Impedance: 36 Ohm
HighPow Impedance: 45 Ohm
Lead Channel Impedance Value: 340 Ohm
Lead Channel Sensing Intrinsic Amplitude: 16.777 mV
MDC IDC MSMT LEADCHNL RA IMPEDANCE VALUE: 464 Ohm
MDC IDC MSMT LEADCHNL RA SENSING INTR AMPL: 2.0525
MDC IDC SET LEADCHNL RA PACING AMPLITUDE: 2 V
MDC IDC SET LEADCHNL RV PACING AMPLITUDE: 2.5 V
MDC IDC SET LEADCHNL RV PACING PULSEWIDTH: 0.6 ms
MDC IDC SET LEADCHNL RV SENSING SENSITIVITY: 0.3 mV
MDC IDC STAT BRADY AP VS PERCENT: 8.31 %
MDC IDC STAT BRADY AS VP PERCENT: 0.27 %
MDC IDC STAT BRADY RV PERCENT PACED: 0.34 %
Zone Setting Detection Interval: 280 ms
Zone Setting Detection Interval: 320 ms
Zone Setting Detection Interval: 350 ms
Zone Setting Detection Interval: 400 ms
Zone Setting Detection Interval: 450 ms

## 2015-06-29 MED ORDER — AMIODARONE HCL 200 MG PO TABS: 200.0000 mg | ORAL_TABLET | Freq: Every day | ORAL | Status: DC
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
## 2015-06-29 NOTE — Patient Instructions (Addendum)
Medication Instructions:  Your physician has recommended you make the following change in your medication:  STOP aspirin START taking amiodarone 200mg  twice a day for two weeks then start taking amiodarone 200mg  only once a day   Labwork: Your physician recommends that you have labs at [facility]: TSH and LFT. Please have results faxed to us at [phone]   Testing/Procedures: A chest x-ray takes a picture of the organs and structures inside the chest, including the heart, lungs, and blood vessels. This test can show several things, including, whether the heart is enlarges; whether fluid is building up in the lungs; and whether pacemaker / defibrillator leads are still in place.   Follow-Up: Your physician wants you to follow-up in: 3 months with Dr. Graciela HusbandsKlein.  You will receive a reminder letter in the mail two months in advance. If you don't receive a letter, please call our office to schedule the follow-up appointment.   Any Other Special Instructions Will Be Listed Below (If Applicable). Your doctor has recommended you see a pulmonologist.

## 2015-06-30 ENCOUNTER — Encounter: Payer: Self-pay | Admitting: Internal Medicine

## 2015-07-07 ENCOUNTER — Encounter: Payer: Self-pay | Admitting: Internal Medicine

## 2015-07-07 ENCOUNTER — Ambulatory Visit (INDEPENDENT_AMBULATORY_CARE_PROVIDER_SITE_OTHER): Payer: Medicare Other | Admitting: Internal Medicine

## 2015-07-07 VITALS — BP 110/60 | HR 74 | Temp 98.0°F | Ht 71.0 in | Wt 175.0 lb

## 2015-07-07 DIAGNOSIS — R05 Cough: Secondary | ICD-10-CM | POA: Diagnosis not present

## 2015-07-07 DIAGNOSIS — R059 Cough, unspecified: Secondary | ICD-10-CM

## 2015-07-07 DIAGNOSIS — J449 Chronic obstructive pulmonary disease, unspecified: Secondary | ICD-10-CM

## 2015-07-07 DIAGNOSIS — R0689 Other abnormalities of breathing: Secondary | ICD-10-CM

## 2015-07-07 DIAGNOSIS — R0600 Dyspnea, unspecified: Secondary | ICD-10-CM | POA: Diagnosis not present
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
## 2015-07-07 NOTE — Patient Instructions (Signed)
Follow up with Dr. Dema SeverinMungal in 4-6 weeks - pulmonary function testing prior to follow up - albuterol nebulizer - 1 nebulize treatment every 6 hours for 3 days, then stop. - cont with advair, spiriva - incentive spirometry 10-15 times per day.

## 2015-07-12 NOTE — Assessment & Plan Note (Signed)
The standardized cough guidelines published in Chest by [facility] [place] [place] in 2006 are still the best available and consist of a multiple step process (up to 12!) , not a single office visit,  and are intended  to address this problem logically,  with an alogrithm dependent on response to empiric treatment at  each progressive step  to determine a specific diagnosis with  minimal addtional testing needed. Therefore if adherence is an issue or can't be accurately verified,  it's very unlikely the standard evaluation and treatment will be successful here.    Furthermore, response to therapy (other than acute cough suppression, which should only be used short term with avoidance of narcotic containing cough syrups if possible), can be a gradual process for which the patient may perceive immediate benefit.  I believe at this time the patient's chronic cough, is multifactorial in nature, top on the differential list his use of amiodarone, COPD, deconditioning, atelectasis. I have reviewed his chest x-rays and he does have persistent left hemidiaphragm for at least the past one year, this could be causing some level of dyspnea but not so much cough. His amiodarone has been resumed, and certain patient populations especially those with underlying parenchymal lung disease, such as COPD, the use of amiodarone can cause chronic cough and worsening dyspnea even at lower doses. Even after cessation of amiodarone, the active metabolite can remain in the lung for a prolonged period of time, and in some patients they will have persistent non-resolvable cough.   Will attempt to further evaluate patient's cough with a pulmonary function testing, however I'm not certain that he'll be able to perform the necessary maneuvers, determining his lung volumes such as RV and TLC along with DLCO will help narrow the differential to a restrictive process that could be due to amiodarone pulmonary toxicity.  Plan: -Pulmonary function  testing -Continue COPD medications -Incentive spirometry 10-15 times per day -Albuterol nebulizer scheduled for the next 3 days then only as needed for persistent cough, wheezing, severe shortness of breath.

## 2015-07-12 NOTE — Assessment & Plan Note (Signed)
Per chart review patient carries a history of chronic COPD that is O2 dependent, currently on 2 L of oxygen continuously. He has had multiple recurrent suspected upper respiratory tract infections/COPD bronchitis that has been treated with multiple rounds of antibiotics. The suspected "bronchitis" episodes could be due to worsening COPD versus amiodarone pulmonary toxicity versus deconditioning. Patient is a somewhat difficult historian and could not recall his tobacco history, however he was able to state that he may smoke for a total of 5 years intermittently as a teenager, and does not believe that he developed COPD from smoking. 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 In any event we'll try to perform a pulmonary function test and at this time continue current COPD regiment which is Advair, Spiriva, chronic O2, as needed albuterol.

## 2015-07-12 NOTE — Assessment & Plan Note (Signed)
Multifactorial: COPD, deconditioning, immobility, congestive heart failure, poor cardiac function, cardiac tachycardia dysrhythmias., Persistent elevated left hemidiaphragm  I have reviewed the patient's chart in great detail, he does have a persistent left hemidiaphragm that has been there for at least the past one year, this could be adding to some level of worsening dyspnea from baseline, especially if there is another underlying cardiac or respiratory process that's exacerbating this cause. At this time, will further evaluate with pulmonary function testing, hopefully patient is able to perform the maneuvers appropriately. He is also on amiodarone, 200 mg daily at this time, per review of records. As stated in the plan for cough, amiodarone can cause worsening respiratory status especially in patients with underlying parenchymal lung disease, such as COPD. Managing this patient's tachycardia arrhythmia along with his COPD and poor cardiac/respiratory status, in the setting of amiodarone, will be challenging; if pulmonary function continues to worsen may need to permanently stop amiodarone or find a suitable alternative for his tachycardia dysrhythmias.

## 2015-07-14 ENCOUNTER — Encounter: Payer: Self-pay | Admitting: Internal Medicine

## 2015-08-10 ENCOUNTER — Encounter: Payer: Medicare Other | Admitting: Internal Medicine

## 2015-08-10 ENCOUNTER — Encounter: Payer: Self-pay | Admitting: Internal Medicine

## 2015-08-10 ENCOUNTER — Ambulatory Visit (INDEPENDENT_AMBULATORY_CARE_PROVIDER_SITE_OTHER): Payer: Medicare Other | Admitting: Internal Medicine

## 2015-08-10 ENCOUNTER — Ambulatory Visit: Payer: Self-pay | Admitting: Internal Medicine

## 2015-08-10 VITALS — BP 114/58 | HR 58 | Ht 71.0 in | Wt 181.0 lb

## 2015-08-10 DIAGNOSIS — J449 Chronic obstructive pulmonary disease, unspecified: Secondary | ICD-10-CM

## 2015-08-10 DIAGNOSIS — R05 Cough: Secondary | ICD-10-CM

## 2015-08-10 DIAGNOSIS — R059 Cough, unspecified: Secondary | ICD-10-CM

## 2015-08-10 LAB — PULMONARY FUNCTION TEST
FEF 25-75 Pre: 0.32 L/sec
FEF2575-%Pred-Pre: 15 %
FEV1-%Pred-Pre: 24 %
FEV1-PRE: 0.67 L
FEV1FVC-%Pred-Pre: 61 %
FEV6-%PRED-PRE: 41 %
FEV6-Pre: 1.45 L
FEV6FVC-%PRED-PRE: 104 %
FVC-%Pred-Pre: 39 %
FVC-PRE: 1.47 L
Pre FEV1/FVC ratio: 45 %
Pre FEV6/FVC Ratio: 99 %

## 2015-08-10 MED ORDER — FLUTTER DEVI: Status: AC

## 2015-08-10 MED ORDER — FLUTICASONE-SALMETEROL 500-50 MCG/DOSE IN AEPB: 1.0000 | INHALATION_SPRAY | Freq: Two times a day (BID) | RESPIRATORY_TRACT | Status: AC

## 2015-08-10 NOTE — Assessment & Plan Note (Signed)
The standardized cough guidelines published in Chest by [place] in 2006 are still the best available and consist of a multiple step process (up to 12!) , not a single office visit,  and are intended  to address this problem logically,  with an alogrithm dependent on response to empiric treatment at  each progressive step  to determine a specific diagnosis with  minimal addtional testing needed. Therefore if adherence is an issue or can't be accurately verified,  it's very unlikely the standard evaluation and treatment will be successful here.    Furthermore, response to therapy (other than acute cough suppression, which should only be used short term with avoidance of narcotic containing cough syrups if possible), can be a gradual process for which the patient may perceive immediate benefit.  I believe at this time the patient's chronic cough, is multifactorial in nature, top on the differential list his use of amiodarone, COPD, deconditioning, atelectasis. I have reviewed his chest x-rays and he does have persistent left hemidiaphragm for at least the past one year, this could be causing some level of dyspnea but not so much cough. His amiodarone has been resumed, and certain patient populations especially those with underlying parenchymal lung disease, such as COPD, the use of amiodarone can cause chronic cough and worsening dyspnea even at lower doses. Even after cessation of amiodarone, the active metabolite can remain in the lung for a prolonged period of time, and in some patients they will have persistent non-resolvable cough.    Patient with severe obstruction noted on pulmonary spirometry, FEV1 24%, unable to complete lung volumes. The severe decline in his FEV1 is suggestive of worsening disease, there is a somewhat temporal relationship to amiodarone use by chart review. I would recommend cessation of amiodarone given his overall pulmonary status, again this is difficult given his complex  tachyarrhythmia management. In any event, we'll refer amiodarone management to cardiology.  Plan: -COPD optimization with Advair 500/50 -Flutter valve -Amiodarone management by cardiology

## 2015-08-10 NOTE — Addendum Note (Signed)
Addended by: Meyer Cory R on: 08/10/2015 12:11 PM   Modules accepted: Orders

## 2015-08-10 NOTE — Patient Instructions (Signed)
Follow up with Dr. Dema Severin in 3 months - stop current Advair 115 - start Advair 500/50, 1 puff in the AM and 1 puff in the PM. -gargle and rinse after each use.  - cont with spiriva - cont with as needed albuterol - flutter valve/acapella daily

## 2015-08-10 NOTE — Assessment & Plan Note (Signed)
Per chart review patient carries a history of chronic COPD that is O2 dependent, currently on 2 L of oxygen continuously. He has had multiple recurrent suspected upper respiratory tract infections/COPD bronchitis that has been treated with multiple rounds of antibiotics. The suspected "bronchitis" episodes could be due to worsening COPD versus amiodarone pulmonary toxicity versus deconditioning. Patient is a somewhat difficult historian and could not recall his tobacco history, however he was able to state that he may smoke for a total of 5 years intermittently as a teenager, and does not believe that he developed COPD from smoking. 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 However, spirometry was difficult due to patient understanding of techniques. In any event, there is a temporal relationship to his clinical symptoms and the use of amiodarone, he does have a severe decrease in FEV1, which could be related to amiodarone side effects. Typically, DLCO and a restrictive pattern on PFTs are observe with amiodarone pulmonary toxicity however it is difficult to establish this relationship given the inconclusive lung volume testing. I will refer cessation or continuation of amiodarone to the patient's cardiologist given his history of tachyarrhythmias.   Plan: -Stop Advair 115-21 -COPD optimization, start Advair 500/50, 1 puff in the morning and 1 puff in the evening. Gargle and rinse after each use, -Continue Spiriva daily -Flutter valve for cough

## 2015-08-10 NOTE — Progress Notes (Signed)
MRN# [id number] [name] 1930-11-15   CC: Chief Complaint  Patient presents with  . Follow-up    prod cough w/white,foamy mucus, sometimes thick or thin; chest tightness at times;       Brief History: HPI 06/2015 Patient is a pleasant 81 year old male seen in consultation today for chronic cough and shortness of breath after cessation of amiodarone for an NSVT and atrial fibrillation. Patient is a somewhat difficult historian, the majority of history is obtained per chart review. 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 Patient has been followed with cardiology and EP for his cardiac dysrhythmias, tachycardia, patient started to develop some shortness of breath and worsening cough over the last 3-4 months, as previously on amiodarone 400 mg daily until about 2 weeks ago, when it was stopped, but it was noted that he started to have back tachyarrhythmias, and amiodarone was resumed at 200 mg daily. Patient endorses a productive cough, thick whitish sputum,, cough (multiple times throughout the day), worsening shortness of breath from baseline. He denies fever, night sweats. Patient uses a wheelchair and walker for mobility assistance due to severe osteoarthritis. Per chart review, he carries a history of COPD and is currently on broncho-dilators, he also carries a history of allergies and is on nasal steroids. During his visit today he stated that he smoked for about 5 years, from 81 years of age to about 81 years of age. He denies any other tobacco use, denies cocaine use, denies hearing, denies marijuana, denies  any other illicit drugs, denies alcohol abuse. 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 During the last 3-4 months of this cough and shortness of breath, he's been treated with multiple rounds of antibiotics, only to have symptoms shortly return. Patient is accompanied by a nursing aid today, who states that per his current medication chart review, he is getting albuterol scheduled every 6 hours for the last couple of weeks, given his level of cough and shortness of breath. Plan: -Pulmonary function testing -Continue COPD medications -Incentive spirometry 10-15 times per day -Albuterol nebulizer scheduled for the next 3 days then only as needed for persistent cough, wheezing, severe shortness of breath.  Events since last clinic visit: Patient presents today for a follow-up visit of COPD, chronic cough. He was also scheduled to have a pulmonary function test. However, patient unable to complete lung volume maneuvers, and test was converted to spirometry only. He continues to have a chronic cough with mild thick white productive sputum, and intermittent shortness of breath. At his last visit amiodarone thought to be a player in overall clinical respiratory status.  Today patient states that he still has chronic cough, it is not worse than last visit. And he still has chronic shortness of breath is also not worse from last visit.    Medication:   Current Outpatient Rx  Name  Route  Sig  Dispense  Refill  . acetaZOLAMIDE (DIAMOX) 250 MG tablet   Oral   Take 250 mg by mouth 2 (two) times daily.         Marland Kitchen  albuterol (ACCUNEB) 0.63 MG/3ML nebulizer solution   Nebulization   Take 1 ampule by nebulization every 6 (six) hours as needed for wheezing.         . Amino Acids-Protein Hydrolys (FEEDING SUPPLEMENT, PRO-STAT  SUGAR FREE 64,) LIQD   Oral   Take 30 mLs by mouth 2 (two) times daily.         Marland Kitchen amiodarone (PACERONE) 200 MG tablet   Oral   Take 1 tablet (200 mg total) by mouth daily. Take 200mg  twice a day for two weeks then change to 200mg  once a day   60 tablet   3   . apixaban (ELIQUIS) 5 MG TABS tablet   Oral   Take 1 tablet (5 mg total) by mouth 2 (two) times daily.   60 tablet   3   . brimonidine (ALPHAGAN) 0.15 % ophthalmic solution   Both Eyes   Place 1 drop into both eyes 2 (two) times daily.         . carvedilol (COREG) 3.125 MG tablet   Oral   Take 1 tablet (3.125 mg total) by mouth 2 (two) times daily with a meal.   60 tablet   3   . cetirizine (ZYRTEC) 10 MG tablet   Oral   Take 10 mg by mouth daily.         . cholecalciferol (VITAMIN D) 400 UNITS TABS tablet   Oral   Take 50,000 Units by mouth every 30 (thirty) days. On e on the 1st of the Month         . docusate sodium (COLACE) 100 MG capsule   Oral   Take 100 mg by mouth 2 (two) times daily.         . fluticasone (FLONASE) 50 MCG/ACT nasal spray   Each Nare   Place 2 sprays into both nostrils daily.         . furosemide (LASIX) 80 MG tablet   Oral   Take 40 mg by mouth daily.          Marland Kitchen HYDROcodone-acetaminophen (NORCO) 7.5-325 MG per tablet   Oral   Take 1 tablet by mouth every 6 (six) hours as needed for moderate pain.         Marland Kitchen latanoprost (XALATAN) 0.005 % ophthalmic solution      1 drop at bedtime.         . NON FORMULARY      Oxygen 3 liters daily.         . polyethylene glycol (MIRALAX / GLYCOLAX) packet   Oral   Take 17 g by mouth at bedtime.         . senna (SENOKOT) 8.6 MG tablet   Oral   Take 1 tablet by mouth 2 (two) times daily.         . tamsulosin (FLOMAX) 0.4 MG CAPS   Oral   Take 0.4 mg by mouth daily.         Marland Kitchen tiotropium (SPIRIVA HANDIHALER) 18 MCG inhalation capsule   Inhalation   Place 1 capsule (18 mcg total) into inhaler and inhale daily.    30 capsule   6   . Fluticasone-Salmeterol (ADVAIR DISKUS) 500-50 MCG/DOSE AEPB   Inhalation   Inhale 1 puff into the lungs 2 (two) times daily. Gargle and rinse after use.   60 each   4   . Respiratory Therapy Supplies (FLUTTER) DEVI      Use as directed   1  each   0      Review of Systems: Gen:  Denies  fever, sweats, chills HEENT: Denies blurred vision, double vision, ear pain, eye pain, hearing loss, nose bleeds, sore throat Cvc:  No dizziness, chest pain or heaviness Resp:   Admits XB:MWUXLKG cough, and shortness of breath Gi: Denies swallowing difficulty, stomach pain, nausea or vomiting, diarrhea, constipation, bowel incontinence Gu:  Denies bladder incontinence, burning urine Ext:   No Joint pain, stiffness or swelling Skin: No skin rash, easy bruising or bleeding or hives Endoc:  No polyuria, polydipsia , polyphagia or weight change Other:  All other systems negative  Allergies:  Review of patient's allergies indicates no known allergies.  Physical Examination:  VS: BP 114/58 mmHg  Pulse 58  Ht 5\' 11"  (1.803 m)  Wt 181 lb (82.101 kg)  BMI 25.26 kg/m2  SpO2 98%  General Appearance: No distress  HEENT: PERRLA, no ptosis, no other lesions noticed Pulmonary: Fine basilar wheezes with expiration, fine basal crackles, chronic, good respiratory effort  Cardiovascular:  Normal S1,S2.  No m/r/g.     Abdomen:Exam: Benign, Soft, non-tender, No masses  Skin:   warm, no rashes, no ecchymosis  Extremities: normal, no cyanosis, clubbing, warm with normal capillary refill.    Pulmonary function testing/spirometry-patient unable to complete full PFTs today, 08/10/2015, he essentially completed a spirometry. FVC 39% FEV1 24% FEV1/FVC 45%  Interpretation: Severe obstruction, flow loops difficult to interpret and except but does meet ATS criteria. Patient unable to perform lung volume techniques.  Assessment and Plan: 81 year old male past medical history of COPD,  tachyarrhythmias, chronic cough, seen for follow-up visit COPD (chronic obstructive pulmonary disease) with chronic bronchitis Per chart review patient carries a history of chronic COPD that is O2 dependent, currently on 2 L of oxygen continuously. He has had multiple recurrent suspected upper respiratory tract infections/COPD bronchitis that has been treated with multiple rounds of antibiotics. The suspected "bronchitis" episodes could be due to worsening COPD versus amiodarone pulmonary toxicity versus deconditioning. Patient is a somewhat difficult historian and could not recall his tobacco history, however he was able to state that he may smoke for a total of 5 years intermittently as a teenager, and does not believe that he developed COPD from smoking. 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 However, spirometry was difficult due to patient understanding of techniques. In any event, there is a temporal relationship to his clinical symptoms and the use of amiodarone, he does have a severe decrease in FEV1, which could be related to amiodarone side effects. Typically, DLCO and a restrictive pattern on PFTs are observe with amiodarone pulmonary toxicity however it is difficult to establish this relationship given the inconclusive lung volume testing. I will refer cessation or continuation of amiodarone to the patient's cardiologist given his history of tachyarrhythmias.   Plan: -Stop Advair 115-21 -COPD optimization, start Advair 500/50, 1 puff in the morning and 1 puff in the evening. Gargle and rinse after each use, -Continue Spiriva daily -Flutter valve for cough    Cough The standardized  cough guidelines published in Chest by [place] in 2006 are still the best available and consist of a multiple step process (up to 12!) , not a single office visit,  and are intended  to address this problem logically,  with an alogrithm dependent on response to empiric treatment at  each progressive step  to determine a specific diagnosis with  minimal addtional testing needed. Therefore if adherence is an issue or can't be accurately verified,  it's very unlikely the standard evaluation and treatment will be successful here.    Furthermore, response to therapy (other than acute cough suppression, which should only be used short term with avoidance of narcotic containing cough syrups if possible), can be a gradual process for which the patient may perceive immediate benefit.  I believe at this time the patient's chronic cough, is multifactorial in nature, top on the differential list his use of amiodarone, COPD, deconditioning, atelectasis. I have reviewed his chest x-rays and he does have persistent left hemidiaphragm for at least the past one year, this could be causing some level of dyspnea but not so much cough. His amiodarone has been resumed, and certain patient populations especially those with underlying parenchymal lung disease, such as COPD, the use of amiodarone can cause chronic cough and worsening dyspnea even at lower doses. Even after cessation of amiodarone, the active metabolite can remain in the lung for a prolonged period of time, and in some patients they will have persistent non-resolvable cough.    Patient with severe obstruction noted on pulmonary spirometry, FEV1 24%, unable to complete lung volumes. The severe decline in his FEV1 is suggestive of worsening disease, there is a somewhat temporal relationship to amiodarone use by chart review. I would recommend cessation of amiodarone given his overall pulmonary status, again this is difficult given his complex tachyarrhythmia  management. In any event, we'll refer amiodarone management to cardiology.  Plan: -COPD optimization with Advair 500/50 -Flutter valve -Amiodarone management by cardiology      Updated Medication List Outpatient Encounter Prescriptions as of 08/10/2015  Medication Sig  . acetaZOLAMIDE (DIAMOX) 250 MG tablet Take 250 mg by mouth 2 (two) times daily.  Marland Kitchen albuterol (ACCUNEB) 0.63 MG/3ML nebulizer solution Take 1 ampule by nebulization every 6 (six) hours as needed for wheezing.  . Amino Acids-Protein Hydrolys (FEEDING SUPPLEMENT, PRO-STAT SUGAR FREE 64,) LIQD Take 30 mLs by mouth 2 (two) times daily.  Marland Kitchen amiodarone (PACERONE) 200 MG tablet Take 1 tablet (200 mg total) by mouth daily. Take  twice a day for two weeks then change to  once a day  . apixaban (ELIQUIS) 5 MG TABS tablet Take 1 tablet (5 mg total) by mouth 2 (two) times daily.  . brimonidine (ALPHAGAN) 0.15 % ophthalmic solution Place 1 drop into both eyes 2 (two) times daily.  . carvedilol (COREG) 3.125 MG tablet Take 1 tablet (3.125 mg total) by mouth 2 (two) times daily with a meal.  . cetirizine (ZYRTEC) 10 MG tablet Take 10 mg by mouth daily.  . cholecalciferol (VITAMIN D) 400 UNITS TABS tablet Take 50,000 Units by mouth every 30 (thirty) days. On e on the 1st of the Month  . docusate sodium (COLACE) 100 MG capsule Take 100 mg by mouth 2 (two) times daily.  . fluticasone (FLONASE) 50 MCG/ACT nasal spray Place 2 sprays into both nostrils daily.  . furosemide (LASIX) 80 MG tablet Take 40 mg by mouth daily.   Marland Kitchen HYDROcodone-acetaminophen (NORCO) 7.5-325 MG per tablet Take 1 tablet by mouth every 6 (six) hours as needed for moderate pain.  Marland Kitchen latanoprost (XALATAN) 0.005 % ophthalmic solution 1 drop at bedtime.  . NON FORMULARY Oxygen 3 liters daily.  Marland Kitchen  polyethylene glycol (MIRALAX / GLYCOLAX) packet Take 17 g by mouth at bedtime.  . senna (SENOKOT) 8.6 MG tablet Take 1 tablet by mouth 2 (two) times daily.  . tamsulosin  (FLOMAX) 0.4 MG CAPS Take 0.4 mg by mouth daily.  Marland Kitchen tiotropium (SPIRIVA HANDIHALER) 18 MCG inhalation capsule Place 1 capsule (18 mcg total) into inhaler and inhale daily.  . [DISCONTINUED] fluticasone-salmeterol (ADVAIR HFA) 115-21 MCG/ACT inhaler Inhale 2 puffs into the lungs 2 (two) times daily.  . Fluticasone-Salmeterol (ADVAIR DISKUS) 500-50 MCG/DOSE AEPB Inhale 1 puff into the lungs 2 (two) times daily. Gargle and rinse after use.  Marland Kitchen Respiratory Therapy Supplies (FLUTTER) DEVI Use as directed   No facility-administered encounter medications on file as of 08/10/2015.    Orders for this visit: No orders of the defined types were placed in this encounter.    Thank  you for the visitation and for allowing  [facility] to assist in the care of your patient. Our recommendations are noted above.  Please contact us if we can be of further service.  Stephanie Acre, MD [facility] Office Number: [phone]

## 2015-10-20 ENCOUNTER — Encounter: Payer: Self-pay | Admitting: Pulmonary Disease

## 2015-10-20 ENCOUNTER — Ambulatory Visit (INDEPENDENT_AMBULATORY_CARE_PROVIDER_SITE_OTHER): Payer: Medicare Other | Admitting: Pulmonary Disease

## 2015-10-20 VITALS — BP 120/68 | HR 63 | Ht 71.0 in | Wt 177.0 lb

## 2015-10-20 DIAGNOSIS — J449 Chronic obstructive pulmonary disease, unspecified: Secondary | ICD-10-CM | POA: Diagnosis not present

## 2015-10-20 DIAGNOSIS — Z01818 Encounter for other preprocedural examination: Secondary | ICD-10-CM | POA: Diagnosis not present

## 2015-10-20 NOTE — Progress Notes (Signed)
PROBLEMS: Severe COPD - FEV1 0.67 liters (24% pred) Cardiomyopathy (LVEF 20-25% by TTE 04/2014) R knee arthropathy Lives in NH/assisted living   SUBJ: [age] M referred for pre-op pulm eval for possible R knee surgery. It is not clear to me what surgery is being considered but, per description, sounds like TKR. Previously sen by Dr Dema SeverinMungal. He has very severe obstructive lung disease despite minimal smoking history. He is maintained on Advair and Spiriva. He is limited as much or more by knee pain than by dyspnea and is largely wheelchair bound. He is referred by Dr Terance HartBronstein who is the MD at the facility where he resides. From a pulmonary perspective, he is at his baseline with no significant cough or sputum production  OBJ: Filed Vitals:   10/20/15 1013  BP: 120/68  Pulse: 63  Height: 5\' 11"  (1.803 m)  Weight: 177 lb (80.287 kg)  SpO2: 93%    Gen: NAD HEENT: WNL Neck: NO LAN, no JVD noted Lungs: Markedly diminished BS throughout, no adventitious sounds, no wheezing Cardiovascular: Reg, no M noted Abdomen: Soft, NT +BS Ext: no C/C/E   Current outpatient prescriptions:  .  acetaZOLAMIDE (DIAMOX) 250 MG tablet, Take 250 mg by mouth 2 (two) times daily., Disp: , Rfl:  .  albuterol (ACCUNEB) 0.63 MG/3ML nebulizer solution, Take 1 ampule by nebulization every 6 (six) hours as needed for wheezing., Disp: , Rfl:  .  Amino Acids-Protein Hydrolys (FEEDING SUPPLEMENT, PRO-STAT SUGAR FREE 64,) LIQD, Take 30 mLs by mouth 2 (two) times daily., Disp: , Rfl:  .  amiodarone (PACERONE) 200 MG tablet, Take 1 tablet (200 mg total) by mouth daily. Take 200mg  twice a day for two weeks then change to 200mg  once a day, Disp: 60 tablet, Rfl: 3 .  apixaban (ELIQUIS) 5 MG TABS tablet, Take 1 tablet (5 mg total) by mouth 2 (two) times daily., Disp: 60 tablet, Rfl: 3 .  brimonidine (ALPHAGAN) 0.15 % ophthalmic solution, Place 1 drop into both eyes 2 (two) times daily., Disp: , Rfl:  .  carvedilol (COREG) 3.125 MG  tablet, Take 1 tablet (3.125 mg total) by mouth 2 (two) times daily with a meal., Disp: 60 tablet, Rfl: 3 .  cetirizine (ZYRTEC) 10 MG tablet, Take 10 mg by mouth daily., Disp: , Rfl:  .  cholecalciferol (VITAMIN D) 400 UNITS TABS tablet, Take 50,000 Units by mouth every 30 (thirty) days. On e on the 1st of the Month, Disp: , Rfl:  .  docusate sodium (COLACE) 100 MG capsule, Take 100 mg by mouth 2 (two) times daily., Disp: , Rfl:  .  fluticasone (FLONASE) 50 MCG/ACT nasal spray, Place 2 sprays into both nostrils daily., Disp: , Rfl:  .  Fluticasone-Salmeterol (ADVAIR DISKUS) 500-50 MCG/DOSE AEPB, Inhale 1 puff into the lungs 2 (two) times daily. Gargle and rinse after use., Disp: 60 each, Rfl: 4 .  furosemide (LASIX) 80 MG tablet, Take 40 mg by mouth daily. , Disp: , Rfl:  .  HYDROcodone-acetaminophen (NORCO) 7.5-325 MG per tablet, Take 1 tablet by mouth every 6 (six) hours as needed for moderate pain., Disp: , Rfl:  .  latanoprost (XALATAN) 0.005 % ophthalmic solution, 1 drop at bedtime., Disp: , Rfl:  .  NON FORMULARY, Oxygen 3 liters daily., Disp: , Rfl:  .  polyethylene glycol (MIRALAX / GLYCOLAX) packet, Take 17 g by mouth at bedtime., Disp: , Rfl:  .  Respiratory Therapy Supplies (FLUTTER) DEVI, Use as directed, Disp: 1 each, Rfl: 0 .  senna (SENOKOT) 8.6 MG tablet, Take 1 tablet by mouth 2 (two) times daily., Disp: , Rfl:  .  tamsulosin (FLOMAX) 0.4 MG CAPS, Take 0.4 mg by mouth daily., Disp: , Rfl:  .  tiotropium (SPIRIVA HANDIHALER) 18 MCG inhalation capsule, Place 1 capsule (18 mcg total) into inhaler and inhale daily., Disp: 30 capsule, Rfl: 6 .  [DISCONTINUED] potassium chloride (K-DUR) 10 MEQ tablet, Take 1 tablet (10 mEq total) by mouth daily., Disp: 30 tablet, Rfl: 6  DATA: I have reviewed prior PFTs and CXR  IMPRESSION: Severe COPD - FEV1 0.67 liters (24% pred) Cardiomyopathy (LVEF 20-25% by TTE 04/2014) R knee arthropathy  His risk of peri-operative complications is markedly  elevated compared to an average population and is due to severe obstructive lung disease as well as advanced age, severe cardiomyopathy and poor general functional status. The major component of this risk would be that associated with general anesthesia and endotracheal intubation. Further, he is unlikely to get major benefit as he would have difficulty undergoing post operative rehabilitation and would ultimately be limited by cardiopulmonary disease if he were to successfully rehabilitate.  The proposed knee surgery is considered by me to be elective or semi-elective. As such, the risk is prohibitive in my judgment  PLAN: I explained the above impression to the patient and recommended that surgery be avoided if possible. Otherwise, he is to continue the current inhaler medications. Further pulmonary follow up will be with Dr Dema Severin    Merwyn Katos, MD [facility]

## 2015-11-01 ENCOUNTER — Ambulatory Visit: Payer: Self-pay | Admitting: Cardiovascular Disease
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
## 2015-11-09 ENCOUNTER — Ambulatory Visit (INDEPENDENT_AMBULATORY_CARE_PROVIDER_SITE_OTHER): Payer: Medicare Other | Admitting: Internal Medicine

## 2015-11-09 ENCOUNTER — Encounter: Payer: Self-pay | Admitting: Internal Medicine

## 2015-11-09 VITALS — BP 98/60 | HR 75 | Ht 71.0 in | Wt 178.0 lb

## 2015-11-09 DIAGNOSIS — I472 Ventricular tachycardia: Secondary | ICD-10-CM | POA: Diagnosis not present

## 2015-11-09 DIAGNOSIS — I480 Paroxysmal atrial fibrillation: Secondary | ICD-10-CM | POA: Diagnosis not present

## 2015-11-09 DIAGNOSIS — Z95810 Presence of automatic (implantable) cardiac defibrillator: Secondary | ICD-10-CM | POA: Diagnosis not present

## 2015-11-09 DIAGNOSIS — I4729 Other ventricular tachycardia: Secondary | ICD-10-CM

## 2015-11-09 DIAGNOSIS — I255 Ischemic cardiomyopathy: Secondary | ICD-10-CM

## 2015-11-09 DIAGNOSIS — I5022 Chronic systolic (congestive) heart failure: Secondary | ICD-10-CM

## 2015-11-09 NOTE — Patient Instructions (Addendum)
Medication Instructions: - no changes  Labwork: - Your physician recommends that you have lab work : CMET/ TSH (please take the orders to your facility so they may draw these for you in the next day or so  Procedures/Testing: - none  Follow-Up: - Your physician wants you to follow-up in: 6 months with the device clinic & 1 year with Dr. Graciela HusbandsKlein. You will receive a reminder letter in the mail two months in advance. If you don't receive a letter, please call our office to schedule the follow-up appointment.  Any Additional Special Instructions Will Be Listed Below (If Applicable).

## 2015-11-10 ENCOUNTER — Encounter: Payer: Self-pay | Admitting: Internal Medicine

## 2015-11-10 LAB — CUP PACEART INCLINIC DEVICE CHECK
Implantable Lead Implant Date: 20090313
Implantable Lead Implant Date: 20090313
Implantable Lead Location: 753859
Lead Channel Setting Pacing Amplitude: 2 V
Lead Channel Setting Pacing Amplitude: 3 V
Lead Channel Setting Pacing Pulse Width: 0.6 ms
MDC IDC LEAD LOCATION: 753860
MDC IDC SESS DTM: 20161116075953
MDC IDC SET LEADCHNL RV SENSING SENSITIVITY: 0.3 mV

## 2015-11-15 ENCOUNTER — Telehealth: Payer: Self-pay

## 2015-11-15 NOTE — Telephone Encounter (Signed)
S/w Hope in Ortho regarding cardiac clearance for pt knee replacement. States she faxed clearance to our office in October. Date of surgery TBD. Advised Hope to re-fax clearance request and I will give to Dr. Graciela HusbandsKlein tomorrow.

## 2015-11-15 NOTE — Telephone Encounter (Signed)
Needs cardiac clearance for total right knee replacement. Please call

## 2015-11-15 NOTE — Telephone Encounter (Signed)
Checking on status of cardiac clearance. Please call and advise

## 2015-11-15 NOTE — Telephone Encounter (Signed)
Not sure why I received this message. I think this should be for cardiology. We have already seenpt for pulmonary clearance. Thanks.

## 2015-11-17 ENCOUNTER — Telehealth: Payer: Self-pay

## 2015-11-17 NOTE — Telephone Encounter (Signed)
Per Dr. Graciela HusbandsKlein, pt needs lexi myoview before he can clear him for knee surgery. Pt is a resident of [facility]. Left message for sister, Prudy FeelerOra, to call office

## 2015-11-23 ENCOUNTER — Other Ambulatory Visit: Payer: Self-pay

## 2015-11-23 ENCOUNTER — Telehealth: Payer: Self-pay

## 2015-11-23 DIAGNOSIS — R0602 Shortness of breath: Secondary | ICD-10-CM

## 2015-11-23 NOTE — Telephone Encounter (Signed)
S/w pt regarding Dr. Odessa FlemingKlein's recommendation for lexi myoview for cardiac clearance as pt is to have right knee replacement. Explained procedure to pt who verbalized understanding and is agreeable w/plan. Myoview scheduled for 12/7, 9am at [facility] S/w Misty StanleyLisa, nurse on B hall, to review instructions and coordinate transportation. Misty StanleyLisa verbalized understanding and states she will complete paperwork for pt to [facility] at 8:45am. Information pkt mailed to pt at [facility].

## 2015-11-23 NOTE — Patient Instructions (Signed)
Your physician has requested that you have a lexiscan myoview. Please follow instruction sheet, as given.  [facility] MYOVIEW  Your caregiver has ordered a Stress Test with nuclear imaging. The purpose of this test is to evaluate the blood supply to your heart muscle. This procedure is referred to as a "Non-Invasive Stress Test." This is because other than having an IV started in your vein, nothing is inserted or "invades" your body. Cardiac stress tests are done to find areas of poor blood flow to the heart by determining the extent of coronary artery disease (CAD). Some patients exercise on a treadmill, which naturally increases the blood flow to your heart, while others who are  unable to walk on a treadmill due to physical limitations have a pharmacologic/chemical stress agent called Lexiscan . This medicine will mimic walking on a treadmill by temporarily increasing your coronary blood flow.   Please note: these test may take anywhere between 2-4 hours to complete  PLEASE REPORT TO [facility]  THE VOLUNTEERS AT THE FIRST DESK WILL DIRECT YOU WHERE TO GO  Date of Procedure: Wednesday, December 7, 9am Arrival Time for Procedure: 8:45am  Instructions regarding medication:    _xx___:  Hold coreg the night before procedure and morning of procedure _xx_: Hold lasix the morning of procedure.    PLEASE NOTIFY THE OFFICE AT LEAST 24 HOURS IN ADVANCE IF YOU ARE UNABLE TO KEEP YOUR APPOINTMENT.  [phone] AND  PLEASE NOTIFY NUCLEAR MEDICINE AT [facility] AT LEAST 24 HOURS IN ADVANCE IF YOU ARE UNABLE TO KEEP YOUR APPOINTMENT. [phone]  How to prepare for your Myoview test:   Do not eat or drink after midnight  No caffeine for 24 hours prior to test  No smoking 24 hours prior to test.  Your medication may be taken with water.  If your doctor stopped a medication because of this test, do not take that medication.  Ladies, please do not wear dresses.  Skirts or pants are  appropriate. Please wear a short sleeve shirt.  No perfume, cologne or lotion.  Wear comfortable walking shoes. No heels!          Cardiac Nuclear Scanning A cardiac nuclear scan is used to check your heart for problems, such as the following:  A portion of the heart is not getting enough blood.  Part of the heart muscle has died, which happens with a heart attack.  The heart wall is not working normally.  In this test, a radioactive dye (tracer) is injected into your bloodstream. After the tracer has traveled to your heart, a scanning device is used to measure how much of the tracer is absorbed by or distributed to various areas of your heart. LET [facility] CARE PROVIDER KNOW ABOUT:  Any allergies you have.  All medicines you are taking, including vitamins, herbs, eye drops, creams, and over-the-counter medicines.  Previous problems you or members of your family have had with the use of anesthetics.  Any blood disorders you have.  Previous surgeries you have had.  Medical conditions you have.  RISKS AND COMPLICATIONS Generally, this is a safe procedure. However, as with any procedure, problems can occur. Possible problems include:   Serious chest pain.  Rapid heartbeat.  Sensation of warmth in your chest. This usually passes quickly. BEFORE THE PROCEDURE Ask your health care provider about changing or stopping your regular medicines. PROCEDURE This procedure is usually done at a hospital and takes 2-4 hours.  An IV tube is  inserted into one of your veins.  Your health care provider will inject a small amount of radioactive tracer through the tube.  You will then wait for 20-40 minutes while the tracer travels through your bloodstream.  You will lie down on an exam table so images of your heart can be taken. Images will be taken for about 15-20 minutes.  You will exercise on a treadmill or stationary bike. While you exercise, your heart activity will be  monitored with an electrocardiogram (ECG), and your blood pressure will be checked.  If you are unable to exercise, you may be given a medicine to make your heart beat faster.  When blood flow to your heart has peaked, tracer will again be injected through the IV tube.  After 20-40 minutes, you will get back on the exam table and have more images taken of your heart.  When the procedure is over, your IV tube will be removed. AFTER THE PROCEDURE  You will likely be able to leave shortly after the test. Unless your health care provider tells you otherwise, you may return to your normal schedule, including diet, activities, and medicines.  Make sure you find out how and when you will get your test results.   This information is not intended to replace advice given to you by your health care provider. Make sure you discuss any questions you have with your health care provider.   Document Released: 01/05/2005 Document Revised: 12/16/2013 Document Reviewed: 11/19/2013 Elsevier Interactive Patient Education [employer]2016 Elsevier Inc.

## 2015-11-30 ENCOUNTER — Telehealth: Payer: Self-pay

## 2015-11-30 NOTE — Telephone Encounter (Signed)
S/w Misty StanleyLisa, RN on B Hall regarding [employer] myoview instructions. Reviewed information with Misty StanleyLisa who verbalized understanding. Provided CB number in case of any questions.

## 2015-11-30 NOTE — Telephone Encounter (Signed)
S/w Abby in Nuc Med of phone call from [facility] that pt is cancelling lexi myoview. Order cancelled in Epic.

## 2015-11-30 NOTE — Telephone Encounter (Signed)
Would like to cancel Stress test tomorrow. States pt will not be having surgery due to pulmonary will not clear him, and states pt will not have the surgery at all. Please call.

## 2015-12-01 ENCOUNTER — Ambulatory Visit: Payer: Medicare Other

## 2015-12-30 ENCOUNTER — Ambulatory Visit: Payer: Self-pay | Admitting: Cardiovascular Disease

## 2016-01-04 ENCOUNTER — Ambulatory Visit (INDEPENDENT_AMBULATORY_CARE_PROVIDER_SITE_OTHER): Payer: Medicare Other | Admitting: Cardiovascular Disease

## 2016-01-04 ENCOUNTER — Encounter: Payer: Self-pay | Admitting: Cardiovascular Disease

## 2016-01-04 ENCOUNTER — Other Ambulatory Visit
Admission: RE | Admit: 2016-01-04 | Discharge: 2016-01-04 | Disposition: A | Discharge status: Home / Self Care | Payer: Medicare Other | Source: Ambulatory Visit | Attending: Cardiovascular Disease | Admitting: Cardiovascular Disease

## 2016-01-04 VITALS — BP 90/53 | HR 65 | Ht 71.0 in | Wt 170.0 lb

## 2016-01-04 DIAGNOSIS — I472 Ventricular tachycardia, unspecified: Secondary | ICD-10-CM

## 2016-01-04 DIAGNOSIS — R05 Cough: Secondary | ICD-10-CM

## 2016-01-04 DIAGNOSIS — I25118 Atherosclerotic heart disease of native coronary artery with other forms of angina pectoris: Secondary | ICD-10-CM

## 2016-01-04 DIAGNOSIS — R059 Cough, unspecified: Secondary | ICD-10-CM

## 2016-01-04 DIAGNOSIS — I5022 Chronic systolic (congestive) heart failure: Secondary | ICD-10-CM

## 2016-01-04 DIAGNOSIS — J441 Chronic obstructive pulmonary disease with (acute) exacerbation: Secondary | ICD-10-CM

## 2016-01-04 DIAGNOSIS — I480 Paroxysmal atrial fibrillation: Secondary | ICD-10-CM | POA: Insufficient documentation

## 2016-01-04 DIAGNOSIS — R0602 Shortness of breath: Secondary | ICD-10-CM

## 2016-01-04 DIAGNOSIS — Z95810 Presence of automatic (implantable) cardiac defibrillator: Secondary | ICD-10-CM

## 2016-01-04 DIAGNOSIS — I6529 Occlusion and stenosis of unspecified carotid artery: Secondary | ICD-10-CM | POA: Insufficient documentation

## 2016-01-04 DIAGNOSIS — I6523 Occlusion and stenosis of bilateral carotid arteries: Secondary | ICD-10-CM

## 2016-01-04 DIAGNOSIS — I4729 Other ventricular tachycardia: Secondary | ICD-10-CM

## 2016-01-04 LAB — BASIC METABOLIC PANEL
Anion gap: 8 (ref 5–15)
BUN: 25 mg/dL — AB (ref 6–20)
CHLORIDE: 103 mmol/L (ref 101–111)
CO2: 26 mmol/L (ref 22–32)
CREATININE: 1.66 mg/dL — AB (ref 0.61–1.24)
Calcium: 9.2 mg/dL (ref 8.9–10.3)
GFR calc Af Amer: 42 mL/min — ABNORMAL LOW (ref >60)
GFR calc non Af Amer: 37 mL/min — ABNORMAL LOW (ref >60)
GLUCOSE: 85 mg/dL (ref 65–99)
POTASSIUM: 3.7 mmol/L (ref 3.5–5.1)
Sodium: 137 mmol/L (ref 135–145)

## 2016-01-04 LAB — TSH: TSH: 1.525 u[IU]/mL (ref 0.350–4.500)

## 2016-01-04 NOTE — Assessment & Plan Note (Signed)
Nonsustained VT followed by Dr. Graciela HusbandsKlein On amiodarone We will reorder amiodarone surveillance lab work

## 2016-01-04 NOTE — Assessment & Plan Note (Signed)
Followed by pulmonary, on chronic oxygen, several inhalers

## 2016-01-04 NOTE — Assessment & Plan Note (Addendum)
Coronary artery disease seen on CT scan in 2014  Patient reported prior workup including cardiac catheterization at outside facility Records not available

## 2016-01-04 NOTE — Assessment & Plan Note (Signed)
Followed by Dr. Lara MulchKlein Download showing short runs of nonsustained VT per the notes

## 2016-01-04 NOTE — Patient Instructions (Signed)
You are doing well. No medication changes were made.  We will check labs today, BMP and TSH  Please call us if you have new issues that need to be addressed before your next appt.  Your physician wants you to follow-up in: 6 months.  You will receive a reminder letter in the mail two months in advance. If you don't receive a letter, please call our office to schedule the follow-up appointment.

## 2016-01-04 NOTE — Assessment & Plan Note (Signed)
cough consistent with chronic bronchitis Managed by pulmonary

## 2016-01-04 NOTE — Assessment & Plan Note (Signed)
Prior carotid ultrasound in the past several years showing mild to moderate plaquing, no significant stenosis

## 2016-01-04 NOTE — Assessment & Plan Note (Signed)
Paroxysmal atrial fibrillation. Maintaining normal sinus rhythm on today's visit. No medication changes made, on anticoagulation

## 2016-01-04 NOTE — Assessment & Plan Note (Signed)
Appears relatively euvolemic, weight within his normal range 166 up to 170 pounds We will recheck basic metabolic panel today as he is on Lasix
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
## 2016-01-11 ENCOUNTER — Encounter: Payer: Self-pay | Admitting: Internal Medicine

## 2016-01-11 ENCOUNTER — Ambulatory Visit (INDEPENDENT_AMBULATORY_CARE_PROVIDER_SITE_OTHER): Payer: Medicare Other | Admitting: Internal Medicine

## 2016-01-11 VITALS — BP 110/56 | HR 60 | Wt 174.0 lb

## 2016-01-11 DIAGNOSIS — J449 Chronic obstructive pulmonary disease, unspecified: Secondary | ICD-10-CM

## 2016-01-11 NOTE — Progress Notes (Signed)
PROBLEMS: Severe COPD - FEV1 0.67 liters (24% pred) Cardiomyopathy (LVEF 20-25% by TTE 04/2014) R knee arthropathy Lives in NH/assisted living   SUBJ: [name] referred for pre-op pulm eval for possible R knee surgery. It is not clear to me what surgery is being considered but, per description, sounds like TKR. Previously seen by Dr Sung Amabile. He has very severe obstructive lung disease despite minimal smoking history. He is maintained on Advair and Spiriva. He is limited as much or more by knee pain than by dyspnea and is largely wheelchair bound. He is referred by Dr Terance Hart who is the MD at the facility where he resides. From a pulmonary perspective, he is at his baseline with minimal cough and whitish sputum production  OBJ: Filed Vitals:   01/11/16 0937  BP: 110/56  Pulse: 60  Weight: 174 lb (78.926 kg)  SpO2: 99%    Gen: NAD HEENT: WNL Neck: NO LAN, no JVD noted Lungs: CTAB, mild dec BS at the bases.  Cardiovascular: Reg, no Mclean noted Abdomen: Soft, NT +BS Ext: no C/C/E   Current outpatient prescriptions:  .  acetaZOLAMIDE (DIAMOX) 250 MG tablet, Take 250 mg by mouth 2 (two) times daily., Disp: , Rfl:  .  albuterol (ACCUNEB) 0.63 MG/3ML nebulizer solution, Take 1 ampule by nebulization every 6 (six) hours as needed for wheezing., Disp: , Rfl:  .  Amino Acids-Protein Hydrolys (FEEDING SUPPLEMENT, PRO-STAT SUGAR FREE 64,) LIQD, Take 30 mLs by mouth 2 (two) times daily., Disp: , Rfl:  .  amiodarone (PACERONE) 200 MG tablet, Take 200 mg by mouth daily., Disp: , Rfl:  .  apixaban (ELIQUIS) 5 MG TABS tablet, Take 1 tablet (5 mg total) by mouth 2 (two) times daily., Disp: 60 tablet, Rfl: 3 .  brimonidine (ALPHAGAN) 0.15 % ophthalmic solution, Place 1 drop into both eyes 2 (two) times daily., Disp: , Rfl:  .  carvedilol (COREG) 3.125 MG tablet, Take 1 tablet (3.125 mg total) by mouth 2 (two) times daily with a meal., Disp: 60 tablet, Rfl: 3 .  cetirizine (ZYRTEC) 10 MG tablet, Take 10 mg  by mouth daily., Disp: , Rfl:  .  cholecalciferol (VITAMIN D) 400 UNITS TABS tablet, Take 50,000 Units by mouth every 30 (thirty) days. On e on the 1st of the Month, Disp: , Rfl:  .  docusate sodium (COLACE) 100 MG capsule, Take 100 mg by mouth 2 (two) times daily., Disp: , Rfl:  .  fluticasone (FLONASE) 50 MCG/ACT nasal spray, Place 2 sprays into both nostrils daily., Disp: , Rfl:  .  Fluticasone-Salmeterol (ADVAIR DISKUS) 500-50 MCG/DOSE AEPB, Inhale 1 puff into the lungs 2 (two) times daily. Gargle and rinse after use., Disp: 60 each, Rfl: 4 .  furosemide (LASIX) 80 MG tablet, Take 80 mg by mouth., Disp: , Rfl:  .  HYDROcodone-acetaminophen (NORCO) 7.5-325 MG per tablet, Take 1 tablet by mouth every 6 (six) hours as needed for moderate pain., Disp: , Rfl:  .  latanoprost (XALATAN) 0.005 % ophthalmic solution, 1 drop at bedtime., Disp: , Rfl:  .  NON FORMULARY, Oxygen 3 liters daily., Disp: , Rfl:  .  polyethylene glycol (MIRALAX / GLYCOLAX) packet, Take 17 g by mouth at bedtime., Disp: , Rfl:  .  promethazine (PHENERGAN) 12.5 MG tablet, Take 12.5 mg by mouth every 6 (six) hours as needed for nausea or vomiting., Disp: , Rfl:  .  Respiratory Therapy Supplies (FLUTTER) DEVI, Use as directed, Disp: 1 each, Rfl: 0 .  senna (  SENOKOT) 8.6 MG tablet, Take 1 tablet by mouth 2 (two) times daily., Disp: , Rfl:  .  tamsulosin (FLOMAX) 0.4 MG CAPS, Take 0.4 mg by mouth daily., Disp: , Rfl:  .  tiotropium (SPIRIVA HANDIHALER) 18 MCG inhalation capsule, Place 1 capsule (18 mcg total) into inhaler and inhale daily., Disp: 30 capsule, Rfl: 6 .  [DISCONTINUED] potassium chloride (K-DUR) 10 MEQ tablet, Take 1 tablet (10 mEq total) by mouth daily., Disp: 30 tablet, Rfl: 6  DATA: I have reviewed prior PFTs and CXR  A/P:  COPD (chronic obstructive pulmonary disease) with chronic bronchitis Per chart review patient carries a history of chronic COPD that is O2 dependent, currently on 2 L of oxygen  continuously. He has had multiple recurrent suspected upper respiratory tract infections/COPD bronchitis that has been treated with multiple rounds of antibiotics. The suspected "bronchitis" episodes could be due to worsening COPD versus amiodarone pulmonary toxicity versus deconditioning. Patient is a somewhat difficult historian and could not recall his tobacco history, however he was able to state that he may smoke for a total of 5 years intermittently as a teenager, and does not believe that he developed COPD from smoking. 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 However, spirometry was difficult due to patient understanding of techniques. In any event, there is a temporal relationship to his clinical symptoms and the use of amiodarone, he does have a severe decrease in FEV1, which could be related to amiodarone side effects. Typically, DLCO and a restrictive pattern on PFTs are observe with amiodarone pulmonary toxicity however it is difficult to establish this relationship given the inconclusive lung volume testing. I will refer cessation or continuation of amiodarone to the patient's cardiologist given his history of tachyarrhythmias.   Plan: --COPD optimization, Advair 500/50, 1 puff in the morning and 1 puff in the evening. Gargle and rinse after each use, -Continue Spiriva daily -Flutter valve for cough        Updated Medication List Outpatient Encounter Prescriptions as of 01/11/2016  Medication Sig  . acetaZOLAMIDE (DIAMOX) 250 MG tablet Take 250 mg by mouth 2 (two) times daily.  Marland Kitchen albuterol (ACCUNEB) 0.63 MG/3ML nebulizer solution Take 1 ampule by nebulization every 6 (six) hours as  needed for wheezing.  . Amino Acids-Protein Hydrolys (FEEDING SUPPLEMENT, PRO-STAT SUGAR FREE 64,) LIQD Take 30 mLs by mouth 2 (two) times daily.  Marland Kitchen amiodarone (PACERONE) 200 MG tablet Take 200 mg by mouth daily.  Marland Kitchen apixaban (ELIQUIS) 5 MG TABS tablet Take 1 tablet (5 mg total) by mouth 2 (two) times daily.  . brimonidine (ALPHAGAN) 0.15 % ophthalmic solution Place 1 drop into both eyes 2 (two) times daily.  . carvedilol (COREG) 3.125 MG tablet Take 1 tablet (3.125 mg total) by mouth 2 (two) times daily with a meal.  . cetirizine (ZYRTEC) 10 MG tablet Take 10 mg by mouth daily.  . cholecalciferol (VITAMIN D) 400 UNITS TABS tablet Take 50,000 Units by mouth every 30 (thirty) days. On e on the 1st of the Month  . docusate sodium (COLACE) 100 MG capsule Take 100 mg by mouth 2 (two) times daily.  . fluticasone (FLONASE) 50 MCG/ACT nasal spray Place 2 sprays into both nostrils daily.  . Fluticasone-Salmeterol (ADVAIR DISKUS) 500-50 MCG/DOSE  AEPB Inhale 1 puff into the lungs 2 (two) times daily. Gargle and rinse after use.  . furosemide (LASIX) 80 MG tablet Take 80 mg by mouth.  Marland Kitchen HYDROcodone-acetaminophen (NORCO) 7.5-325 MG per tablet Take 1 tablet by mouth every 6 (six) hours as needed for moderate pain.  Marland Kitchen latanoprost (XALATAN) 0.005 % ophthalmic solution 1 drop at bedtime.  . NON FORMULARY Oxygen 3 liters daily.  . polyethylene glycol (MIRALAX / GLYCOLAX) packet Take 17 g by mouth at bedtime.  . promethazine (PHENERGAN) 12.5 MG tablet Take 12.5 mg by mouth every 6 (six) hours as needed for nausea or vomiting.  Marland Kitchen Respiratory Therapy Supplies (FLUTTER) DEVI Use as directed  . senna (SENOKOT) 8.6 MG tablet Take 1 tablet by mouth 2 (two) times daily.  . tamsulosin (FLOMAX) 0.4 MG CAPS Take 0.4 mg by mouth daily.  Marland Kitchen tiotropium (SPIRIVA HANDIHALER) 18 MCG inhalation capsule Place 1 capsule (18 mcg total) into inhaler and inhale daily.   No facility-administered encounter medications on file as of  01/11/2016.

## 2016-01-11 NOTE — Assessment & Plan Note (Signed)
Per chart review patient carries a history of chronic COPD that is O2 dependent, currently on 2 L of oxygen continuously. He has had multiple recurrent suspected upper respiratory tract infections/COPD bronchitis that has been treated with multiple rounds of antibiotics. The suspected "bronchitis" episodes could be due to worsening COPD versus amiodarone pulmonary toxicity versus deconditioning. Patient is a somewhat difficult historian and could not recall his tobacco history, however he was able to state that he may smoke for a total of 5 years intermittently as a teenager, and does not believe that he developed COPD from smoking. 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 However, spirometry was difficult due to patient understanding of techniques. In any event, there is a temporal relationship to his clinical symptoms and the use of amiodarone, he does have a severe decrease in FEV1, which could be related to amiodarone side effects. Typically, DLCO and a restrictive pattern on PFTs are observe with amiodarone pulmonary toxicity however it is difficult to establish this relationship given the inconclusive lung volume testing. I will refer cessation or continuation of amiodarone to the patient's cardiologist given his history of tachyarrhythmias.   Plan: --COPD optimization, Advair 500/50, 1 puff in the morning and 1 puff in the evening. Gargle and rinse after each use, -Continue Spiriva daily -Flutter valve for cough

## 2016-01-11 NOTE — Patient Instructions (Signed)
Follow up with Dr. Dema Severin in: 3 months - cont with Advair and spiriva - cont with flutter valve daily - spirometry prior to follow up visit.

## 2016-03-07 ENCOUNTER — Encounter: Payer: Self-pay | Admitting: Internal Medicine

## 2016-03-07 ENCOUNTER — Ambulatory Visit (INDEPENDENT_AMBULATORY_CARE_PROVIDER_SITE_OTHER): Payer: Medicare Other | Admitting: *Deleted

## 2016-03-07 DIAGNOSIS — Z95810 Presence of automatic (implantable) cardiac defibrillator: Secondary | ICD-10-CM | POA: Diagnosis not present

## 2016-03-07 DIAGNOSIS — I5022 Chronic systolic (congestive) heart failure: Secondary | ICD-10-CM

## 2016-03-07 DIAGNOSIS — I255 Ischemic cardiomyopathy: Secondary | ICD-10-CM

## 2016-03-08 LAB — CUP PACEART INCLINIC DEVICE CHECK
Battery Voltage: 2.74 V
Brady Statistic RA Percent Paced: 23.84 %
Date Time Interrogation Session: 20170314113728
HIGH POWER IMPEDANCE MEASURED VALUE: 38 Ohm
HighPow Impedance: 46 Ohm
Implantable Lead Location: 753859
Implantable Lead Location: 753860
Implantable Lead Model: 5076
Implantable Lead Model: 6947
Lead Channel Impedance Value: 480 Ohm
Lead Channel Pacing Threshold Amplitude: 0.5 V
Lead Channel Sensing Intrinsic Amplitude: 1.382 mV
Lead Channel Sensing Intrinsic Amplitude: 19.326 mV
MDC IDC LEAD IMPLANT DT: 20090313
MDC IDC LEAD IMPLANT DT: 20090313
MDC IDC MSMT LEADCHNL RA PACING THRESHOLD PULSEWIDTH: 0.4 ms
MDC IDC MSMT LEADCHNL RV IMPEDANCE VALUE: 360 Ohm
MDC IDC MSMT LEADCHNL RV PACING THRESHOLD AMPLITUDE: 1.5 V
MDC IDC MSMT LEADCHNL RV PACING THRESHOLD PULSEWIDTH: 0.6 ms
MDC IDC SET LEADCHNL RA PACING AMPLITUDE: 2 V
MDC IDC SET LEADCHNL RV PACING AMPLITUDE: 3 V
MDC IDC SET LEADCHNL RV PACING PULSEWIDTH: 0.6 ms
MDC IDC SET LEADCHNL RV SENSING SENSITIVITY: 0.3 mV
MDC IDC STAT BRADY AP VP PERCENT: 0.29 %
MDC IDC STAT BRADY AP VS PERCENT: 23.54 %
MDC IDC STAT BRADY AS VP PERCENT: 0.2 %
MDC IDC STAT BRADY AS VS PERCENT: 75.96 %
MDC IDC STAT BRADY RV PERCENT PACED: 0.5 %

## 2016-03-08 NOTE — Progress Notes (Signed)
ICD check in clinic. Normal device function. Thresholds and sensing consistent with previous device measurements. Impedance trends stable over time. 4 NSVT episodes, longest ~11bts. No mode switches. Histogram distribution appropriate for patient and level of activity. No changes made this session. Device programmed at appropriate safety margins. Device programmed to optimize intrinsic conduction. Battery voltage 2.74V, RRT at 2.62V. Patient education completed including shock plan. Alert tones demonstrated for patient, but he is unable to hear tone. ROV with [place] [facility] in 3 months.

## 2016-04-17 ENCOUNTER — Ambulatory Visit (INDEPENDENT_AMBULATORY_CARE_PROVIDER_SITE_OTHER): Payer: Medicare Other | Admitting: Internal Medicine

## 2016-04-17 ENCOUNTER — Encounter: Payer: Self-pay | Admitting: Internal Medicine

## 2016-04-17 VITALS — BP 108/60 | HR 81 | Ht 71.0 in | Wt 172.6 lb

## 2016-04-17 DIAGNOSIS — J449 Chronic obstructive pulmonary disease, unspecified: Secondary | ICD-10-CM

## 2016-04-17 DIAGNOSIS — J209 Acute bronchitis, unspecified: Secondary | ICD-10-CM

## 2016-04-17 MED ORDER — AZITHROMYCIN 250 MG PO TABS: ORAL_TABLET | ORAL | Status: AC

## 2016-04-17 NOTE — Addendum Note (Signed)
Addended by: Meyer CoryAHMAD, MISTY R on: 04/17/2016 11:56 AM   Modules accepted: Orders

## 2016-04-17 NOTE — Assessment & Plan Note (Signed)
Cough with productive sputum, weakness. Recent sick contacts include roommate with upper story tract infection.  Plan: -Z-Pak-use as directed

## 2016-04-17 NOTE — Progress Notes (Signed)
PROBLEMS: Severe COPD - FEV1 0.67 liters (24% pred) Cardiomyopathy (LVEF 20-25% by TTE 04/2014) R knee arthropathy Lives in NH/assisted living   SUBJ: 82 year old male presenting today for follow-up visit of COPD, accompanied by caretaker from [facility]. Patient states that he's had worsening cough over the past 2-3 weeks, no fever, no chills. He states his roommate had a respiratory tract infection and pass it on to him. In office today is having thick productive white sputum. Otherwise no wheezing noted. Review of chart shows he still on amiodarone 200 mg daily as prescribed by cardiology.  OBJ: Filed Vitals:   04/17/16 1057  BP: 108/60  Pulse: 81  Height: 5\' 11"  (1.803 m)  Weight: 172 lb 9.6 oz (78.291 kg)  SpO2: 97%    Gen: NAD HEENT: WNL Neck: NO LAN, no JVD noted Lungs: coarse upper airway sounds, mild dec BS at the bases. +Sputum production - thick white Cardiovascular: Reg, no M noted Abdomen: Soft, NT +BS Ext: no C/C/E   Current outpatient prescriptions:  .  acetaZOLAMIDE (DIAMOX) 250 MG tablet, Take 250 mg by mouth 2 (two) times daily., Disp: , Rfl:  .  albuterol (ACCUNEB) 0.63 MG/3ML nebulizer solution, Take 1 ampule by nebulization every 6 (six) hours as needed for wheezing., Disp: , Rfl:  .  Amino Acids-Protein Hydrolys (FEEDING SUPPLEMENT, PRO-STAT SUGAR FREE 64,) LIQD, Take 30 mLs by mouth 2 (two) times daily. Reported on 03/07/2016, Disp: , Rfl:  .  amiodarone (PACERONE) 200 MG tablet, Take 200 mg by mouth daily., Disp: , Rfl:  .  apixaban (ELIQUIS) 5 MG TABS tablet, Take 1 tablet (5 mg total) by mouth 2 (two) times daily., Disp: 60 tablet, Rfl: 3 .  brimonidine (ALPHAGAN) 0.15 % ophthalmic solution, Place 1 drop into both eyes 2 (two) times daily., Disp: , Rfl:  .  carvedilol (COREG) 3.125 MG tablet, Take 1 tablet (3.125 mg total) by mouth 2 (two) times daily with a meal., Disp: 60 tablet, Rfl: 3 .  cetirizine (ZYRTEC) 10 MG tablet, Take 10 mg by mouth  daily., Disp: , Rfl:  .  cholecalciferol (VITAMIN D) 400 UNITS TABS tablet, Take 50,000 Units by mouth every 30 (thirty) days. On e on the 1st of the Month, Disp: , Rfl:  .  docusate sodium (COLACE) 100 MG capsule, Take 100 mg by mouth 2 (two) times daily., Disp: , Rfl:  .  fluticasone (FLONASE) 50 MCG/ACT nasal spray, Place 2 sprays into both nostrils daily., Disp: , Rfl:  .  Fluticasone-Salmeterol (ADVAIR DISKUS) 500-50 MCG/DOSE AEPB, Inhale 1 puff into the lungs 2 (two) times daily. Gargle and rinse after use., Disp: 60 each, Rfl: 4 .  furosemide (LASIX) 40 MG tablet, , Disp: , Rfl:  .  HYDROcodone-acetaminophen (NORCO/VICODIN) 5-325 MG tablet, , Disp: , Rfl:  .  latanoprost (XALATAN) 0.005 % ophthalmic solution, 1 drop at bedtime., Disp: , Rfl:  .  NON FORMULARY, Oxygen 3 liters daily., Disp: , Rfl:  .  polyethylene glycol (MIRALAX / GLYCOLAX) packet, Take 17 g by mouth at bedtime., Disp: , Rfl:  .  promethazine (PHENERGAN) 12.5 MG tablet, Take 12.5 mg by mouth every 6 (six) hours as needed for nausea or vomiting., Disp: , Rfl:  .  Respiratory Therapy Supplies (FLUTTER) DEVI, Use as directed, Disp: 1 each, Rfl: 0 .  senna (SENOKOT) 8.6 MG tablet, Take 1 tablet by mouth 2 (two) times daily., Disp: , Rfl:  .  tamsulosin (FLOMAX) 0.4 MG CAPS, Take 0.4 mg by  mouth daily., Disp: , Rfl:  .  tiotropium (SPIRIVA HANDIHALER) 18 MCG inhalation capsule, Place 1 capsule (18 mcg total) into inhaler and inhale daily., Disp: 30 capsule, Rfl: 6 .  [DISCONTINUED] potassium chloride (K-DUR) 10 MEQ tablet, Take 1 tablet (10 mEq total) by mouth daily., Disp: 30 tablet, Rfl: 6  DATA: I have reviewed prior PFTs and CXR  A/P: 82 year old male with COPD, now with acute bronchitis.  COPD (chronic obstructive pulmonary disease) with chronic bronchitis Per chart review patient carries a history of chronic COPD that is O2 dependent, currently on 2 L of oxygen continuously. He has had multiple recurrent suspected  upper respiratory tract infections/COPD bronchitis that has been treated with multiple rounds of antibiotics. The suspected "bronchitis" episodes could be due to worsening COPD versus amiodarone pulmonary toxicity versus deconditioning. Patient is a somewhat difficult historian and could not recall his tobacco history, however he was able to state that he may smoke for a total of 5 years intermittently as a teenager, and does not believe that he developed COPD from smoking. 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 However, spirometry was difficult due to patient understanding of techniques. In any event, there is a temporal relationship to his clinical symptoms and the use of amiodarone, he does have a severe decrease in FEV1, which could be related to amiodarone side effects. Typically, DLCO and a restrictive pattern on PFTs are observe with amiodarone pulmonary toxicity however it is difficult to establish this relationship given the inconclusive lung volume testing. I will refer cessation or continuation of amiodarone to the patient's cardiologist given his history of tachyarrhythmias.   Plan: --COPD optimization, Advair 500/50, 1 puff in the morning and 1 puff in the evening. Gargle and rinse after each use, -Continue Spiriva daily -Flutter valve for cough -Amiodarone administration per cardiology recommendations        Acute bronchitis Cough with productive sputum, weakness. Recent sick contacts include roommate with upper story tract infection.  Plan: -Z-Pak-use as directed    Updated Medication List Outpatient Encounter Prescriptions as of 04/17/2016  Medication Sig  . acetaZOLAMIDE  (DIAMOX) 250 MG tablet Take 250 mg by mouth 2 (two) times daily.  Marland Kitchen albuterol (ACCUNEB) 0.63 MG/3ML nebulizer solution Take 1 ampule by nebulization every 6 (six) hours as needed for wheezing.  . Amino Acids-Protein Hydrolys (FEEDING SUPPLEMENT, PRO-STAT SUGAR FREE 64,) LIQD Take 30 mLs by mouth 2 (two) times daily. Reported on 03/07/2016  . amiodarone (PACERONE) 200 MG tablet Take 200 mg by mouth daily.  Marland Kitchen apixaban (ELIQUIS) 5 MG TABS tablet Take 1 tablet (5 mg total) by mouth 2 (two) times daily.  . brimonidine (ALPHAGAN) 0.15 % ophthalmic solution Place 1 drop into both eyes 2 (two) times daily.  . carvedilol (COREG) 3.125 MG tablet Take 1 tablet (3.125 mg total) by mouth 2 (two) times daily with a meal.  . cetirizine (ZYRTEC) 10 MG tablet Take 10 mg by mouth daily.  . cholecalciferol (VITAMIN D) 400 UNITS TABS tablet Take 50,000 Units by mouth every 30 (thirty) days. On e on the 1st of the Month  . docusate sodium (COLACE) 100 MG capsule Take 100 mg by mouth 2 (two) times daily.  . fluticasone (FLONASE) 50 MCG/ACT nasal spray Place  2 sprays into both nostrils daily.  . Fluticasone-Salmeterol (ADVAIR DISKUS) 500-50 MCG/DOSE AEPB Inhale 1 puff into the lungs 2 (two) times daily. Gargle and rinse after use.  . furosemide (LASIX) 40 MG tablet   . HYDROcodone-acetaminophen (NORCO/VICODIN) 5-325 MG tablet   . latanoprost (XALATAN) 0.005 % ophthalmic solution 1 drop at bedtime.  . NON FORMULARY Oxygen 3 liters daily.  . polyethylene glycol (MIRALAX / GLYCOLAX) packet Take 17 g by mouth at bedtime.  . promethazine (PHENERGAN) 12.5 MG tablet Take 12.5 mg by mouth every 6 (six) hours as needed for nausea or vomiting.  Marland Kitchen Respiratory Therapy Supplies (FLUTTER) DEVI Use as directed  . senna (SENOKOT) 8.6 MG tablet Take 1 tablet by mouth 2 (two) times daily.  . tamsulosin (FLOMAX) 0.4 MG CAPS Take 0.4 mg by mouth daily.  Marland Kitchen tiotropium (SPIRIVA HANDIHALER) 18 MCG inhalation capsule Place 1 capsule (18 mcg  total) into inhaler and inhale daily.  . [DISCONTINUED] brimonidine (ALPHAGAN) 0.2 % ophthalmic solution   . [DISCONTINUED] furosemide (LASIX) 80 MG tablet Take 40 mg by mouth daily. Reported on 04/17/2016  . [DISCONTINUED] HYDROcodone-acetaminophen (NORCO) 7.5-325 MG per tablet Take 1 tablet by mouth every 6 (six) hours as needed for moderate pain. Reported on 04/17/2016   No facility-administered encounter medications on file as of 04/17/2016.

## 2016-04-17 NOTE — Patient Instructions (Signed)
Follow up with Dr. Dema SeverinMungal in: 3 months - Continue with Advair and Spiriva -Continue with flutter valve daily -Z-Pak, use as directed.

## 2016-04-17 NOTE — Assessment & Plan Note (Addendum)
Per chart review patient carries a history of chronic COPD that is O2 dependent, currently on 2 L of oxygen continuously. He has had multiple recurrent suspected upper respiratory tract infections/COPD bronchitis that has been treated with multiple rounds of antibiotics. The suspected "bronchitis" episodes could be due to worsening COPD versus amiodarone pulmonary toxicity versus deconditioning. Patient is a somewhat difficult historian and could not recall his tobacco history, however he was able to state that he may smoke for a total of 5 years intermittently as a teenager, and does not believe that he developed COPD from smoking. 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 However, spirometry was difficult due to patient understanding of techniques. In any event, there is a temporal relationship to his clinical symptoms and the use of amiodarone, he does have a severe decrease in FEV1, which could be related to amiodarone side effects. Typically, DLCO and a restrictive pattern on PFTs are observe with amiodarone pulmonary toxicity however it is difficult to establish this relationship given the inconclusive lung volume testing. I will refer cessation or continuation of amiodarone to the patient's cardiologist given his history of tachyarrhythmias.   Plan: --COPD optimization, Advair 500/50, 1 puff in the morning and 1 puff in the evening. Gargle and rinse after each use, -Continue Spiriva daily -Flutter valve for cough -Amiodarone administration per cardiology recommendations       

## 2016-06-06 ENCOUNTER — Ambulatory Visit (INDEPENDENT_AMBULATORY_CARE_PROVIDER_SITE_OTHER): Payer: Medicare Other | Admitting: *Deleted

## 2016-06-06 ENCOUNTER — Encounter: Payer: Self-pay | Admitting: Internal Medicine

## 2016-06-06 DIAGNOSIS — Z95810 Presence of automatic (implantable) cardiac defibrillator: Secondary | ICD-10-CM

## 2016-06-06 DIAGNOSIS — I5022 Chronic systolic (congestive) heart failure: Secondary | ICD-10-CM | POA: Diagnosis not present

## 2016-06-06 LAB — CUP PACEART INCLINIC DEVICE CHECK
Brady Statistic AP VP Percent: 0.22 %
Brady Statistic AS VP Percent: 0.28 %
Brady Statistic AS VS Percent: 82.91 %
Brady Statistic RV Percent Paced: 0.5 %
HIGH POWER IMPEDANCE MEASURED VALUE: 46 Ohm
HighPow Impedance: 36 Ohm
Implantable Lead Implant Date: 20090313
Implantable Lead Location: 753859
Implantable Lead Location: 753860
Lead Channel Impedance Value: 348 Ohm
Lead Channel Pacing Threshold Amplitude: 1.5 V
Lead Channel Pacing Threshold Pulse Width: 0.6 ms
Lead Channel Sensing Intrinsic Amplitude: 19.326 mV
Lead Channel Setting Pacing Amplitude: 2 V
Lead Channel Setting Pacing Pulse Width: 0.6 ms
Lead Channel Setting Sensing Sensitivity: 0.3 mV
MDC IDC LEAD IMPLANT DT: 20090313
MDC IDC MSMT BATTERY VOLTAGE: 2.66 V
MDC IDC MSMT LEADCHNL RA IMPEDANCE VALUE: 456 Ohm
MDC IDC MSMT LEADCHNL RA PACING THRESHOLD AMPLITUDE: 1 V
MDC IDC MSMT LEADCHNL RA PACING THRESHOLD PULSEWIDTH: 0.4 ms
MDC IDC MSMT LEADCHNL RA SENSING INTR AMPL: 1.969 mV
MDC IDC SESS DTM: 20170613113726
MDC IDC SET LEADCHNL RV PACING AMPLITUDE: 3 V
MDC IDC STAT BRADY AP VS PERCENT: 16.59 %
MDC IDC STAT BRADY RA PERCENT PACED: 16.81 %

## 2016-06-06 NOTE — Progress Notes (Signed)
ICD check in clinic. Normal device function. Thresholds and sensing consistent with previous device measurements. Impedance trends stable over time. No evidence of any ventricular arrhythmias. No mode switches. Histogram distribution appropriate for patient and level of activity. OptiVol abnormal 05/23/16-present, patient asymptomatic. No changes made this session. Device programmed at appropriate safety margins. Device programmed to optimize intrinsic conduction. Battery voltage 2.66V, RRT at 2.62V (4.4 months minimum per tech services). Patient education completed including shock plan. Alert tones demonstrated for patient, but he is unable to hear it. ROV with [place] [facility] on 09/12/16 and ROV with [facility] in 10/2016.

## 2016-07-17 ENCOUNTER — Encounter: Payer: Self-pay | Admitting: Internal Medicine

## 2016-07-17 ENCOUNTER — Ambulatory Visit (INDEPENDENT_AMBULATORY_CARE_PROVIDER_SITE_OTHER): Payer: Medicare Other | Admitting: Internal Medicine

## 2016-07-17 VITALS — BP 136/68 | HR 67 | Ht 71.0 in | Wt 172.2 lb

## 2016-07-17 DIAGNOSIS — J209 Acute bronchitis, unspecified: Secondary | ICD-10-CM

## 2016-07-17 DIAGNOSIS — J449 Chronic obstructive pulmonary disease, unspecified: Secondary | ICD-10-CM

## 2016-07-17 DIAGNOSIS — R05 Cough: Secondary | ICD-10-CM | POA: Diagnosis not present

## 2016-07-17 DIAGNOSIS — R059 Cough, unspecified: Secondary | ICD-10-CM

## 2016-07-17 MED ORDER — PREDNISONE 20 MG PO TABS: 20.0000 mg | ORAL_TABLET | Freq: Every day | ORAL | 0 refills | Status: DC

## 2016-07-17 MED ORDER — FLUTTER DEVI: 0 refills | Status: AC

## 2016-07-17 NOTE — Progress Notes (Signed)
PROBLEMS: Severe COPD - FEV1 0.67 liters (24% pred) Cardiomyopathy (LVEF 20-25% by TTE 04/2014) R knee arthropathy Lives in NH/assisted living   SUBJ: 82 year old male presenting today for follow-up visit of COPD, accompanied by caretaker from [facility]. States that he still has a chronic cough, thick white sputum production, mostly in the mornings. He has a new roommate at his nursing facility, who adjusted the air conditioning frequently, and he believes that his cough and congestion and we needed to this today. Review of records showed that he has an ophthalmology evaluation for possible cataracts in August 2017.  OBJ: Vitals:   07/17/16 0930  BP: 136/68  Pulse: 67  SpO2: 95%  Weight: 172 lb 3.2 oz (78.1 kg)  Height:  (1.803 m)    Gen: NAD HEENT: WNL Neck: NO LAN, no JVD noted Lungs: coarse upper airway sounds, mild dec BS at the bases. +Sputum production - thick white Cardiovascular: Reg, no M noted Abdomen: Soft, NT +BS Ext: no C/C/E   Current Outpatient Prescriptions:  .  acetaZOLAMIDE (DIAMOX) 250 MG tablet, Take 250 mg by mouth 2 (two) times daily., Disp: , Rfl:  .  albuterol (ACCUNEB) 0.63 MG/3ML nebulizer solution, Take 1 ampule by nebulization every 6 (six) hours as needed for wheezing., Disp: , Rfl:  .  Amino Acids-Protein Hydrolys (FEEDING SUPPLEMENT, PRO-STAT SUGAR FREE 64,) LIQD, Take 30 mLs by mouth 2 (two) times daily. Reported on 06/06/2016, Disp: , Rfl:  .  amiodarone (PACERONE) 200 MG tablet, Take 200 mg by mouth daily., Disp: , Rfl:  .  apixaban (ELIQUIS) 5 MG TABS tablet, Take 1 tablet (5 mg total) by mouth 2 (two) times daily., Disp: 60 tablet, Rfl: 3 .  brimonidine (ALPHAGAN) 0.15 % ophthalmic solution, Place 1 drop into both eyes 2 (two) times daily., Disp: , Rfl:  .  carvedilol (COREG) 3.125 MG tablet, Take 1 tablet (3.125 mg total) by mouth 2 (two) times daily with a meal., Disp: 60 tablet, Rfl: 3 .  cetirizine (ZYRTEC) 10 MG tablet, Take 10  mg by mouth daily., Disp: , Rfl:  .  cholecalciferol (VITAMIN D) 400 UNITS TABS tablet, Take 50,000 Units by mouth every 30 (thirty) days. On e on the 1st of the Month, Disp: , Rfl:  .  ciclopirox (PENLAC) 8 % solution, Apply topically at bedtime. Apply over nail and surrounding skin. Apply daily over previous coat. After seven (7) days, may remove with alcohol and continue cycle., Disp: , Rfl:  .  docusate sodium (COLACE) 100 MG capsule, Take 100 mg by mouth 2 (two) times daily., Disp: , Rfl:  .  fluticasone (FLONASE) 50 MCG/ACT nasal spray, Place 2 sprays into both nostrils daily., Disp: , Rfl:  .  Fluticasone-Salmeterol (ADVAIR DISKUS) 500-50 MCG/DOSE AEPB, Inhale 1 puff into the lungs 2 (two) times daily. Gargle and rinse after use., Disp: 60 each, Rfl: 4 .  furosemide (LASIX) 40 MG tablet, Take 40 mg by mouth daily. , Disp: , Rfl:  .  HYDROcodone-acetaminophen (NORCO/VICODIN) 5-325 MG tablet, Take 1 tablet by mouth every 4 (four) hours as needed. , Disp: , Rfl:  .  latanoprost (XALATAN) 0.005 % ophthalmic solution, 1 drop at bedtime., Disp: , Rfl:  .  Menthol, Topical Analgesic, (BIOFREEZE EX), Apply topically as needed., Disp: , Rfl:  .  NON FORMULARY, Oxygen 3 liters daily., Disp: , Rfl:  .  polyethylene glycol (MIRALAX / GLYCOLAX) packet, Take 17 g by mouth at bedtime., Disp: , Rfl:  .  promethazine (PHENERGAN) 12.5 MG tablet, Take 12.5 mg by mouth every 6 (six) hours as needed for nausea or vomiting., Disp: , Rfl:  .  Respiratory Therapy Supplies (FLUTTER) DEVI, Use as directed, Disp: 1 each, Rfl: 0 .  senna (SENOKOT) 8.6 MG tablet, Take 1 tablet by mouth 2 (two) times daily., Disp: , Rfl:  .  tamsulosin (FLOMAX) 0.4 MG CAPS, Take 0.4 mg by mouth daily., Disp: , Rfl:  .  tiotropium (SPIRIVA HANDIHALER) 18 MCG inhalation capsule, Place 1 capsule (18 mcg total) into inhaler and inhale daily., Disp: 30 capsule, Rfl: 6 .  predniSONE (DELTASONE) 20 MG tablet, Take 1 tablet (20 mg total) by mouth  daily., Disp: 5 tablet, Rfl: 0 .  Respiratory Therapy Supplies (FLUTTER) DEVI, Use as directed, Disp: 1 each, Rfl: 0  DATA: I have reviewed prior PFTs and CXR  A/P: 82 year old male with COPD, now with acute on chronic bronchitis.  Cough Cough is chronic -Continue with COPD meds -We'll give a prednisone prescription today, 1 tab of 20 mg daily 5 days  Acute bronchitis Cough with productive sputum, weakness. Recent sick contacts include roommate with upper respiratory tract infection.  Plan: - Acute on chronic bronchitis -Prednisone 20 mg, 1 tab daily 5 days  COPD (chronic obstructive pulmonary disease) with chronic bronchitis Per chart review patient carries a history of chronic COPD that is O2 dependent, currently on 2 L of oxygen continuously. He has had multiple recurrent suspected upper respiratory tract infections/COPD bronchitis that has been treated with multiple rounds of antibiotics. The suspected "bronchitis" episodes could be due to worsening COPD versus amiodarone pulmonary toxicity versus deconditioning. Patient is a somewhat difficult historian and could not recall his tobacco history, however he was able to state that he may smoke for a total of 5 years intermittently as a teenager, and does not believe that he developed COPD from smoking. 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 However, spirometry was difficult due to patient understanding of techniques. In any event, there is a temporal relationship to his clinical symptoms and the use of amiodarone, he does have a severe decrease in FEV1, which could be related to amiodarone side effects. Typically, DLCO and a restrictive  pattern on PFTs are observe with amiodarone pulmonary toxicity however it is difficult to establish this relationship given the inconclusive lung volume testing. I will refer cessation or continuation of amiodarone to the patient's cardiologist given his history of tachyarrhythmias.   Plan: --COPD optimization, Advair 500/50, 1 puff in the morning and 1 puff in the evening. Gargle and rinse after each use, -Continue Spiriva daily -Flutter valve for cough -Amiodarone administration per cardiology recommendations         Updated Medication List Outpatient Encounter Prescriptions as of 07/17/2016  Medication Sig  . acetaZOLAMIDE (DIAMOX) 250 MG tablet Take 250 mg by mouth 2 (two) times daily.  Marland Kitchen albuterol (ACCUNEB) 0.63 MG/3ML nebulizer solution Take 1 ampule by nebulization every 6 (six) hours as needed for wheezing.  . Amino Acids-Protein Hydrolys (FEEDING SUPPLEMENT, PRO-STAT SUGAR FREE 64,) LIQD Take 30 mLs by mouth 2 (two) times daily. Reported on 06/06/2016  . amiodarone (PACERONE) 200 MG tablet Take 200 mg by mouth daily.  Marland Kitchen apixaban (ELIQUIS) 5 MG TABS tablet  Take 1 tablet (5 mg total) by mouth 2 (two) times daily.  . brimonidine (ALPHAGAN) 0.15 % ophthalmic solution Place 1 drop into both eyes 2 (two) times daily.  . carvedilol (COREG) 3.125 MG tablet Take 1 tablet (3.125 mg total) by mouth 2 (two) times daily with a meal.  . cetirizine (ZYRTEC) 10 MG tablet Take 10 mg by mouth daily.  . cholecalciferol (VITAMIN D) 400 UNITS TABS tablet Take 50,000 Units by mouth every 30 (thirty) days. On e on the 1st of the Month  . ciclopirox (PENLAC) 8 % solution Apply topically at bedtime. Apply over nail and surrounding skin. Apply daily over previous coat. After seven (7) days, may remove with alcohol and continue cycle.  . docusate sodium (COLACE) 100 MG capsule Take 100 mg by mouth 2 (two) times daily.  . fluticasone (FLONASE) 50 MCG/ACT nasal spray Place 2 sprays into both nostrils daily.   . Fluticasone-Salmeterol (ADVAIR DISKUS) 500-50 MCG/DOSE AEPB Inhale 1 puff into the lungs 2 (two) times daily. Gargle and rinse after use.  . furosemide (LASIX) 40 MG tablet Take 40 mg by mouth daily.   Marland Kitchen HYDROcodone-acetaminophen (NORCO/VICODIN) 5-325 MG tablet Take 1 tablet by mouth every 4 (four) hours as needed.   . latanoprost (XALATAN) 0.005 % ophthalmic solution 1 drop at bedtime.  . Menthol, Topical Analgesic, (BIOFREEZE EX) Apply topically as needed.  . NON FORMULARY Oxygen 3 liters daily.  . polyethylene glycol (MIRALAX / GLYCOLAX) packet Take 17 g by mouth at bedtime.  . promethazine (PHENERGAN) 12.5 MG tablet Take 12.5 mg by mouth every 6 (six) hours as needed for nausea or vomiting.  Marland Kitchen Respiratory Therapy Supplies (FLUTTER) DEVI Use as directed  . senna (SENOKOT) 8.6 MG tablet Take 1 tablet by mouth 2 (two) times daily.  . tamsulosin (FLOMAX) 0.4 MG CAPS Take 0.4 mg by mouth daily.  Marland Kitchen tiotropium (SPIRIVA HANDIHALER) 18 MCG inhalation capsule Place 1 capsule (18 mcg total) into inhaler and inhale daily.  . predniSONE (DELTASONE) 20 MG tablet Take 1 tablet (20 mg total) by mouth daily.  Marland Kitchen Respiratory Therapy Supplies (FLUTTER) DEVI Use as directed   No facility-administered encounter medications on file as of 07/17/2016.

## 2016-07-17 NOTE — Assessment & Plan Note (Signed)
Cough with productive sputum, weakness. Recent sick contacts include roommate with upper respiratory tract infection.  Plan: - Acute on chronic bronchitis -Prednisone 20 mg, 1 tab daily 5 days

## 2016-07-17 NOTE — Assessment & Plan Note (Signed)
Cough is chronic -Continue with COPD meds -We'll give a prednisone prescription today, 1 tab of 20 mg daily 5 days

## 2016-07-17 NOTE — Assessment & Plan Note (Signed)
Per chart review patient carries a history of chronic COPD that is O2 dependent, currently on 2 L of oxygen continuously. He has had multiple recurrent suspected upper respiratory tract infections/COPD bronchitis that has been treated with multiple rounds of antibiotics. The suspected "bronchitis" episodes could be due to worsening COPD versus amiodarone pulmonary toxicity versus deconditioning. Patient is a somewhat difficult historian and could not recall his tobacco history, however he was able to state that he may smoke for a total of 5 years intermittently as a teenager, and does not believe that he developed COPD from smoking. 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 However, spirometry was difficult due to patient understanding of techniques. In any event, there is a temporal relationship to his clinical symptoms and the use of amiodarone, he does have a severe decrease in FEV1, which could be related to amiodarone side effects. Typically, DLCO and a restrictive pattern on PFTs are observe with amiodarone pulmonary toxicity however it is difficult to establish this relationship given the inconclusive lung volume testing. I will refer cessation or continuation of amiodarone to the patient's cardiologist given his history of tachyarrhythmias.   Plan: --COPD optimization, Advair 500/50, 1 puff in the morning and 1 puff in the evening. Gargle and rinse after each use, -Continue Spiriva daily -Flutter valve for cough -Amiodarone administration per cardiology recommendations

## 2016-07-17 NOTE — Patient Instructions (Signed)
Follow up with Dr. Dema Severin in: 3 months - Continue with Advair and Spiriva -Continue with flutter valve daily - Prednisone 20 mg, 1 tab daily with breakfast for 5 days

## 2016-07-31 ENCOUNTER — Encounter: Payer: Self-pay | Admitting: *Deleted

## 2016-08-04 NOTE — Discharge Instructions (Signed)

## 2016-08-09 ENCOUNTER — Encounter: Payer: Self-pay | Admitting: Anesthesiology

## 2016-08-09 ENCOUNTER — Ambulatory Visit: Payer: Medicare Other | Admitting: Anesthesiology

## 2016-08-09 ENCOUNTER — Ambulatory Visit
Admission: RE | Admit: 2016-08-09 | Discharge: 2016-08-09 | Disposition: A | Discharge status: Home / Self Care | Payer: Medicare Other | Source: Ambulatory Visit | Attending: Ophthalmology | Admitting: Ophthalmology

## 2016-08-09 ENCOUNTER — Encounter
Admission: RE | Disposition: A | Discharge status: Home / Self Care | Payer: Self-pay | Source: Ambulatory Visit | Attending: Ophthalmology

## 2016-08-09 DIAGNOSIS — I259 Chronic ischemic heart disease, unspecified: Secondary | ICD-10-CM | POA: Insufficient documentation

## 2016-08-09 DIAGNOSIS — N289 Disorder of kidney and ureter, unspecified: Secondary | ICD-10-CM | POA: Diagnosis not present

## 2016-08-09 DIAGNOSIS — Z87891 Personal history of nicotine dependence: Secondary | ICD-10-CM | POA: Insufficient documentation

## 2016-08-09 DIAGNOSIS — I739 Peripheral vascular disease, unspecified: Secondary | ICD-10-CM | POA: Insufficient documentation

## 2016-08-09 DIAGNOSIS — I110 Hypertensive heart disease with heart failure: Secondary | ICD-10-CM | POA: Insufficient documentation

## 2016-08-09 DIAGNOSIS — N400 Enlarged prostate without lower urinary tract symptoms: Secondary | ICD-10-CM | POA: Diagnosis not present

## 2016-08-09 DIAGNOSIS — Z950 Presence of cardiac pacemaker: Secondary | ICD-10-CM | POA: Insufficient documentation

## 2016-08-09 DIAGNOSIS — I509 Heart failure, unspecified: Secondary | ICD-10-CM | POA: Diagnosis not present

## 2016-08-09 DIAGNOSIS — Z888 Allergy status to other drugs, medicaments and biological substances status: Secondary | ICD-10-CM | POA: Diagnosis not present

## 2016-08-09 DIAGNOSIS — J449 Chronic obstructive pulmonary disease, unspecified: Secondary | ICD-10-CM | POA: Insufficient documentation

## 2016-08-09 DIAGNOSIS — E1136 Type 2 diabetes mellitus with diabetic cataract: Secondary | ICD-10-CM | POA: Diagnosis not present

## 2016-08-09 DIAGNOSIS — F329 Major depressive disorder, single episode, unspecified: Secondary | ICD-10-CM | POA: Insufficient documentation

## 2016-08-09 DIAGNOSIS — I2510 Atherosclerotic heart disease of native coronary artery without angina pectoris: Secondary | ICD-10-CM | POA: Insufficient documentation

## 2016-08-09 DIAGNOSIS — H2512 Age-related nuclear cataract, left eye: Secondary | ICD-10-CM | POA: Diagnosis present

## 2016-08-09 DIAGNOSIS — Z9981 Dependence on supplemental oxygen: Secondary | ICD-10-CM | POA: Insufficient documentation

## 2016-08-09 HISTORY — DX: Chronic kidney disease, unspecified: N18.9

## 2016-08-09 HISTORY — DX: Presence of cardiac pacemaker: Z95.0

## 2016-08-09 HISTORY — DX: Benign prostatic hyperplasia without lower urinary tract symptoms: N40.0

## 2016-08-09 HISTORY — DX: Muscle weakness (generalized): M62.81

## 2016-08-09 HISTORY — PX: CATARACT EXTRACTION W/PHACO: SHX586

## 2016-08-09 HISTORY — DX: Major depressive disorder, single episode, unspecified: F32.9

## 2016-08-09 HISTORY — DX: Chronic obstructive pulmonary disease, unspecified: J44.9

## 2016-08-09 HISTORY — DX: Depression, unspecified: F32.A

## 2016-08-09 HISTORY — DX: Secondary hyperparathyroidism of renal origin: N25.81

## 2016-08-09 SURGERY — PHACOEMULSIFICATION, CATARACT, WITH IOL INSERTION
Anesthesia: Monitor Anesthesia Care | Laterality: Left | Wound class: Clean

## 2016-08-09 MED ORDER — CEFUROXIME OPHTHALMIC INJECTION 1 MG/0.1 ML
INJECTION | OPHTHALMIC | Status: DC | PRN
  Administered 2016-08-09: 0.1 mL via OPHTHALMIC

## 2016-08-09 MED ORDER — POLYMYXIN B-TRIMETHOPRIM 10000-0.1 UNIT/ML-% OP SOLN
1.0000 [drp] | OPHTHALMIC | Status: DC | PRN
Start: 2016-08-09 — End: 2016-08-09
  Administered 2016-08-09 (×3): 1 [drp] via OPHTHALMIC

## 2016-08-09 MED ORDER — CYCLOPENTOLATE HCL 2 % OP SOLN
1.0000 [drp] | OPHTHALMIC | Status: DC | PRN
  Administered 2016-08-09 (×4): 1 [drp] via OPHTHALMIC

## 2016-08-09 MED ORDER — NA HYALUR & NA CHOND-NA HYALUR 0.4-0.35 ML IO KIT
PACK | INTRAOCULAR | Status: DC | PRN
  Administered 2016-08-09: 1 mL via INTRAOCULAR

## 2016-08-09 MED ORDER — ACETAMINOPHEN 160 MG/5ML PO SOLN: 325.0000 mg | ORAL | Status: DC | PRN

## 2016-08-09 MED ORDER — LIDOCAINE HCL (PF) 4 % IJ SOLN
INTRAOCULAR | Status: DC | PRN
  Administered 2016-08-09: 1 mL via OPHTHALMIC

## 2016-08-09 MED ORDER — LIDOCAINE HCL 3.5 % OP GEL
1.0000 "application " | Freq: Once | OPHTHALMIC | Status: AC
  Administered 2016-08-09: 1 via OPHTHALMIC

## 2016-08-09 MED ORDER — FENTANYL CITRATE (PF) 100 MCG/2ML IJ SOLN
INTRAMUSCULAR | Status: DC | PRN
  Administered 2016-08-09 (×2): 25 ug via INTRAVENOUS

## 2016-08-09 MED ORDER — ACETAMINOPHEN 325 MG PO TABS: 325.0000 mg | ORAL_TABLET | ORAL | Status: DC | PRN

## 2016-08-09 MED ORDER — POVIDONE-IODINE 5 % OP SOLN
1.0000 "application " | OPHTHALMIC | Status: DC | PRN
  Administered 2016-08-09: 1 via OPHTHALMIC

## 2016-08-09 MED ORDER — TIMOLOL MALEATE 0.5 % OP SOLN
OPHTHALMIC | Status: DC | PRN
  Administered 2016-08-09: 1 [drp] via OPHTHALMIC

## 2016-08-09 MED ORDER — ONDANSETRON HCL 4 MG/2ML IJ SOLN: 4.0000 mg | Freq: Once | INTRAMUSCULAR | Status: DC | PRN

## 2016-08-09 MED ORDER — PHENYLEPHRINE HCL 10 % OP SOLN
1.0000 [drp] | OPHTHALMIC | Status: DC | PRN
  Administered 2016-08-09 (×4): 1 [drp] via OPHTHALMIC

## 2016-08-09 MED ORDER — MIDAZOLAM HCL 2 MG/2ML IJ SOLN
INTRAMUSCULAR | Status: DC | PRN
  Administered 2016-08-09: 1 mg via INTRAVENOUS
  Administered 2016-08-09: 0.5 mg via INTRAVENOUS

## 2016-08-09 MED ORDER — EPINEPHRINE HCL 1 MG/ML IJ SOLN
INTRAOCULAR | Status: DC | PRN
  Administered 2016-08-09: 67 mL via OPHTHALMIC

## 2016-08-09 MED ORDER — TETRACAINE HCL 0.5 % OP SOLN
1.0000 [drp] | OPHTHALMIC | Status: DC | PRN
  Administered 2016-08-09: 1 [drp] via OPHTHALMIC

## 2016-08-09 SURGICAL SUPPLY — 25 items
CANNULA ANT/CHMB 27GA (MISCELLANEOUS) ×3 IMPLANT
CARTRIDGE ABBOTT (MISCELLANEOUS) IMPLANT
GLOVE SURG LX 7.5 STRW (GLOVE) ×2
GLOVE SURG LX STRL 7.5 STRW (GLOVE) ×1 IMPLANT
GLOVE SURG TRIUMPH 8.0 PF LTX (GLOVE) ×3 IMPLANT
GOWN STRL REUS W/ TWL LRG LVL3 (GOWN DISPOSABLE) ×2 IMPLANT
GOWN STRL REUS W/TWL LRG LVL3 (GOWN DISPOSABLE) ×4
LENS IOL TECNIS ITEC 22.0 (Intraocular Lens) ×3 IMPLANT
MARKER SKIN DUAL TIP RULER LAB (MISCELLANEOUS) ×3 IMPLANT
NDL RETROBULBAR .5 NSTRL (NEEDLE) IMPLANT
NEEDLE FILTER BLUNT 18X 1/2SAF (NEEDLE) ×2
NEEDLE FILTER BLUNT 18X1 1/2 (NEEDLE) ×1 IMPLANT
PACK CATARACT BRASINGTON (MISCELLANEOUS) ×3 IMPLANT
PACK EYE AFTER SURG (MISCELLANEOUS) ×3 IMPLANT
PACK OPTHALMIC (MISCELLANEOUS) ×3 IMPLANT
RING MALYGIN 7.0 (MISCELLANEOUS) IMPLANT
SUT ETHILON 10-0 CS-B-6CS-B-6 (SUTURE)
SUT VICRYL  9 0 (SUTURE)
SUT VICRYL 9 0 (SUTURE) IMPLANT
SUTURE EHLN 10-0 CS-B-6CS-B-6 (SUTURE) IMPLANT
SYR 3ML LL SCALE MARK (SYRINGE) ×3 IMPLANT
SYR 5ML LL (SYRINGE) ×3 IMPLANT
SYR TB 1ML LUER SLIP (SYRINGE) ×3 IMPLANT
WATER STERILE IRR 250ML POUR (IV SOLUTION) ×3 IMPLANT
WIPE NON LINTING 3.25X3.25 (MISCELLANEOUS) ×3 IMPLANT

## 2016-08-09 NOTE — Op Note (Signed)
OPERATIVE NOTE  [name] [id number] 08/09/2016   PREOPERATIVE DIAGNOSIS:  Nuclear sclerotic cataract left eye. H25.12   POSTOPERATIVE DIAGNOSIS:    Nuclear sclerotic cataract left eye.     PROCEDURE:  Phacoemusification with posterior chamber intraocular lens placement of the left eye   LENS:   Implant Name Type Inv. Item Serial No. Manufacturer Lot No. LRB No. Used  LENS IOL DIOP 22.0 - [id number] Intraocular Lens LENS IOL DIOP 22.0 [id number]   Left 1        ULTRASOUND TIME: 16  % of 1 minutes 20 seconds, CDE 13.2  SURGEON:  Deirdre Evenerhadwick R. Canaan Holzer, MD   ANESTHESIA:  Topical with tetracaine drops and 2% Xylocaine jelly, augmented with 1% preservative-free intracameral lidocaine.    COMPLICATIONS:  None.   DESCRIPTION OF PROCEDURE:  The patient was identified in the holding room and transported to the operating room and placed in the supine position under the operating microscope.  The left eye was identified as the operative eye and it was prepped and draped in the usual sterile ophthalmic fashion.   A 1 millimeter clear-corneal paracentesis was made at the 1:30 position.  0.5 ml of preservative-free 1% lidocaine was injected into the anterior chamber.  The anterior chamber was filled with Viscoat viscoelastic.  A 2.4 millimeter keratome was used to make a near-clear corneal incision at the 10:30 position.  .  A curvilinear capsulorrhexis was made with a cystotome and capsulorrhexis forceps.  Balanced salt solution was used to hydrodissect and hydrodelineate the nucleus.   Phacoemulsification was then used in stop and chop fashion to remove the lens nucleus and epinucleus.  The remaining cortex was then removed using the irrigation and aspiration handpiece. Provisc was then placed into the capsular bag to distend it for lens placement.  A lens was then injected into the capsular bag.  The remaining viscoelastic was aspirated.   Wounds were hydrated with balanced salt  solution.  The anterior chamber was inflated to a physiologic pressure with balanced salt solution.  No wound leaks were noted. Cefuroxime 0.1 ml of a 10mg /ml solution was injected into the anterior chamber for a dose of 1 mg of intracameral antibiotic at the completion of the case.   Timolol drops were applied to the eye.  The patient was taken to the recovery room in stable condition without complications of anesthesia or surgery.  [name] 08/09/2016, 8:36 AM

## 2016-08-09 NOTE — Anesthesia Procedure Notes (Signed)
Procedure Name: MAC Date/Time: 08/09/2016 8:14 AM Performed by: Maryan RuedWILSON, Fifi Schindler M Pre-anesthesia Checklist: Patient identified, Emergency Drugs available, Suction available, Patient being monitored and Timeout performed Patient Re-evaluated:Patient Re-evaluated prior to inductionOxygen Delivery Method: Nasal cannula

## 2016-08-09 NOTE — Anesthesia Postprocedure Evaluation (Signed)
Anesthesia Post Note  Patient: [name]  Procedure(s) Performed: Procedure(s) (LRB): CATARACT EXTRACTION PHACO AND INTRAOCULAR LENS PLACEMENT (IOC) (Left)  Patient location during evaluation: PACU Anesthesia Type: MAC Level of consciousness: awake and alert Pain management: pain level controlled Vital Signs Assessment: post-procedure vital signs reviewed and stable Respiratory status: spontaneous breathing, nonlabored ventilation, respiratory function stable and patient connected to nasal cannula oxygen Cardiovascular status: stable and blood pressure returned to baseline Anesthetic complications: no    Durene Fruitshomas,  Alleyah Twombly G

## 2016-08-09 NOTE — H&P (Signed)
  The History and Physical notes are on paper, have been signed, and are to be scanned. The patient remains stable and unchanged from the H&P.   Previous H&P reviewed, patient examined, and there are no changes.  [name] 08/09/2016 7:42 AM  

## 2016-08-09 NOTE — Anesthesia Preprocedure Evaluation (Addendum)
Anesthesia Evaluation  Patient identified by MRN, date of birth, ID band Patient awake    Reviewed: Allergy & Precautions, H&P , NPO status   Airway Mallampati: II  TM Distance: >3 FB Neck ROM: full    Dental   Pulmonary shortness of breath, asthma , pneumonia, COPD, former smoker,    + rhonchi        Cardiovascular hypertension, + CAD, + Peripheral Vascular Disease and +CHF  Normal cardiovascular exam+ pacemaker      Neuro/Psych PSYCHIATRIC DISORDERS    GI/Hepatic   Endo/Other  diabetes  Renal/GU Renal disease     Musculoskeletal   Abdominal   Peds  Hematology   Anesthesia Other Findings   Reproductive/Obstetrics                            Anesthesia Physical Anesthesia Plan  ASA: III  Anesthesia Plan: MAC   Post-op Pain Management:    Induction:   Airway Management Planned:   Additional Equipment:   Intra-op Plan:   Post-operative Plan:   Informed Consent: I have reviewed the patients History and Physical, chart, labs and discussed the procedure including the risks, benefits and alternatives for the proposed anesthesia with the patient or authorized representative who has indicated his/her understanding and acceptance.     Plan Discussed with: CRNA  Anesthesia Plan Comments:         Anesthesia Quick Evaluation

## 2016-08-09 NOTE — Transfer of Care (Signed)
Immediate Anesthesia Transfer of Care Note  Patient: [name]  Procedure(s) Performed: Procedure(s) with comments: CATARACT EXTRACTION PHACO AND INTRAOCULAR LENS PLACEMENT (IOC) (Left) - DIABETIC LEAVE PT 2ND  Patient Location: PACU  Anesthesia Type: MAC  Level of Consciousness: awake, alert  and patient cooperative  Airway and Oxygen Therapy: Patient Spontanous Breathing and Patient connected to supplemental oxygen  Post-op Assessment: Post-op Vital signs reviewed, Patient's Cardiovascular Status Stable, Respiratory Function Stable, Patent Airway and No signs of Nausea or vomiting  Post-op Vital Signs: Reviewed and stable  Complications: No apparent anesthesia complications

## 2016-09-08 ENCOUNTER — Telehealth: Payer: Self-pay | Admitting: *Deleted

## 2016-09-08 NOTE — Telephone Encounter (Signed)
Spoke with Wilkie AyeKristy, patient's nurse at [facility].  She is agreeable to rescheduling patient's appointment to 11:30pm on 09/12/16 instead of 8:30am.  She denies any additional questions or concerns at this time.

## 2016-09-12 ENCOUNTER — Ambulatory Visit (INDEPENDENT_AMBULATORY_CARE_PROVIDER_SITE_OTHER): Payer: Medicare Other | Admitting: *Deleted

## 2016-09-12 DIAGNOSIS — Z95810 Presence of automatic (implantable) cardiac defibrillator: Secondary | ICD-10-CM

## 2016-09-16 LAB — CUP PACEART INCLINIC DEVICE CHECK
Brady Statistic AP VP Percent: 0.3 %
Brady Statistic RA Percent Paced: 18.98 %
Brady Statistic RV Percent Paced: 0.71 %
HIGH POWER IMPEDANCE MEASURED VALUE: 38 Ohm
HighPow Impedance: 45 Ohm
Implantable Lead Implant Date: 20090313
Implantable Lead Implant Date: 20090313
Implantable Lead Location: 753859
Implantable Lead Model: 5076
Implantable Lead Model: 6947
Lead Channel Impedance Value: 360 Ohm
Lead Channel Impedance Value: 456 Ohm
Lead Channel Pacing Threshold Amplitude: 1 V
Lead Channel Pacing Threshold Pulse Width: 0.4 ms
Lead Channel Setting Pacing Amplitude: 3 V
Lead Channel Setting Sensing Sensitivity: 0.3 mV
MDC IDC LEAD LOCATION: 753860
MDC IDC MSMT BATTERY VOLTAGE: 2.64 V
MDC IDC MSMT LEADCHNL RA SENSING INTR AMPL: 2.346 mV
MDC IDC MSMT LEADCHNL RV PACING THRESHOLD AMPLITUDE: 1.5 V
MDC IDC MSMT LEADCHNL RV PACING THRESHOLD PULSEWIDTH: 0.6 ms
MDC IDC MSMT LEADCHNL RV SENSING INTR AMPL: 19.326 mV
MDC IDC SESS DTM: 20170919152045
MDC IDC SET LEADCHNL RA PACING AMPLITUDE: 2 V
MDC IDC SET LEADCHNL RV PACING PULSEWIDTH: 0.6 ms
MDC IDC STAT BRADY AP VS PERCENT: 18.68 %
MDC IDC STAT BRADY AS VP PERCENT: 0.41 %
MDC IDC STAT BRADY AS VS PERCENT: 80.61 %

## 2016-09-16 NOTE — Progress Notes (Signed)
ICD check in clinic. Normal device function. Thresholds and sensing consistent with previous device measurements. Impedance trends stable over time. 4 NSVT episodes, longest ~3 sec. No mode switches. Histogram distribution appropriate for patient and level of activity. OptiVol abnormal 09/01/16-present, patient asymptomatic. No changes made this session. Device programmed at appropriate safety margins. Device programmed to optimize intrinsic conduction. Battery voltage 2.64V, RRT at 2.62V (3.6 months on avg per tech services). Patient education completed including shock plan. Alert tones demonstrated for patient, but he is unable to hear it. ROV with ROV with SK/B on 11/21/16.

## 2016-10-10 ENCOUNTER — Ambulatory Visit: Payer: Self-pay | Admitting: Cardiovascular Disease

## 2016-10-27 ENCOUNTER — Ambulatory Visit (INDEPENDENT_AMBULATORY_CARE_PROVIDER_SITE_OTHER): Payer: Medicare Other | Admitting: Cardiovascular Disease

## 2016-10-27 ENCOUNTER — Encounter: Payer: Self-pay | Admitting: Cardiovascular Disease

## 2016-10-27 VITALS — BP 100/50 | HR 66 | Ht 71.0 in | Wt 176.0 lb

## 2016-10-27 DIAGNOSIS — I480 Paroxysmal atrial fibrillation: Secondary | ICD-10-CM

## 2016-10-27 DIAGNOSIS — I255 Ischemic cardiomyopathy: Secondary | ICD-10-CM | POA: Diagnosis not present

## 2016-10-27 DIAGNOSIS — E785 Hyperlipidemia, unspecified: Secondary | ICD-10-CM | POA: Diagnosis not present

## 2016-10-27 DIAGNOSIS — I5022 Chronic systolic (congestive) heart failure: Secondary | ICD-10-CM | POA: Diagnosis not present

## 2016-10-27 DIAGNOSIS — I25118 Atherosclerotic heart disease of native coronary artery with other forms of angina pectoris: Secondary | ICD-10-CM

## 2016-10-27 DIAGNOSIS — I472 Ventricular tachycardia, unspecified: Secondary | ICD-10-CM

## 2016-10-27 DIAGNOSIS — J441 Chronic obstructive pulmonary disease with (acute) exacerbation: Secondary | ICD-10-CM

## 2016-10-27 DIAGNOSIS — I4729 Other ventricular tachycardia: Secondary | ICD-10-CM
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
## 2016-10-27 NOTE — Addendum Note (Signed)
Addended by: Rhea BeltonMOODY, Jakyle Petrucelli R on: 10/27/2016 10:06 AM   Modules accepted: Orders

## 2016-10-27 NOTE — Patient Instructions (Addendum)
Medication Instructions:   No medication changes made  Labwork:  Liver, lipid - to be drawn @ [facility]  Testing/Procedures:  No further testing at this time   Follow-Up: It was a pleasure seeing you in the office today. Please call us if you have new issues that need to be addressed before your next appt.  [phone]  Your physician wants you to follow-up in: 6 months.  You will receive a reminder letter in the mail two months in advance. If you don't receive a letter, please call our office to schedule the follow-up appointment.  If you need a refill on your cardiac medications before your next appointment, please call your pharmacy.

## 2016-11-21 ENCOUNTER — Ambulatory Visit (INDEPENDENT_AMBULATORY_CARE_PROVIDER_SITE_OTHER): Payer: Medicare Other | Admitting: Internal Medicine

## 2016-11-21 ENCOUNTER — Encounter: Payer: Self-pay | Admitting: Internal Medicine

## 2016-11-21 VITALS — BP 118/60 | HR 62 | Ht 70.0 in | Wt 176.0 lb

## 2016-11-21 DIAGNOSIS — I25118 Atherosclerotic heart disease of native coronary artery with other forms of angina pectoris: Secondary | ICD-10-CM

## 2016-11-21 DIAGNOSIS — I6523 Occlusion and stenosis of bilateral carotid arteries: Secondary | ICD-10-CM

## 2016-11-21 DIAGNOSIS — I5022 Chronic systolic (congestive) heart failure: Secondary | ICD-10-CM

## 2016-11-21 DIAGNOSIS — Z95810 Presence of automatic (implantable) cardiac defibrillator: Secondary | ICD-10-CM | POA: Diagnosis not present

## 2016-11-21 DIAGNOSIS — I480 Paroxysmal atrial fibrillation: Secondary | ICD-10-CM | POA: Diagnosis not present

## 2016-11-21 NOTE — Patient Instructions (Signed)
Medication Instructions: - Your physician has recommended you make the following change in your medication:  1) lasix (furosemide) 40 mg- take two tablets (80 mg) x 3 days, then resume one tablet (40 mg) once daily  Labwork: - Your physician recommends that you have lab work today: CMET/ TSH/ CBC  Procedures/Testing: - none ordered  Follow-Up: - Your physician recommends that you schedule a follow-up appointment in: 6 weeks with the [facility] for a battery check  - Your physician wants you to follow-up in: 6 months with Dr. Graciela HusbandsKlein. You will receive a reminder letter in the mail two months in advance. If you don't receive a letter, please call our office to schedule the follow-up appointment.   Any Additional Special Instructions Will Be Listed Below (If Applicable).     If you need a refill on your cardiac medications before your next appointment, please call your pharmacy.
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
## 2016-11-22 LAB — COMPREHENSIVE METABOLIC PANEL
ALBUMIN: 4.2 g/dL (ref 3.5–4.7)
ALT: 8 IU/L (ref 0–44)
AST: 16 IU/L (ref 0–40)
Albumin/Globulin Ratio: 1.8 (ref 1.2–2.2)
Alkaline Phosphatase: 54 IU/L (ref 39–117)
BUN / CREAT RATIO: 22 (ref 10–24)
BUN: 34 mg/dL — AB (ref 8–27)
Bilirubin Total: 0.3 mg/dL (ref 0.0–1.2)
CALCIUM: 9.1 mg/dL (ref 8.6–10.2)
CO2: 21 mmol/L (ref 18–29)
CREATININE: 1.55 mg/dL — AB (ref 0.76–1.27)
Chloride: 101 mmol/L (ref 96–106)
GFR, EST AFRICAN AMERICAN: 47 mL/min/{1.73_m2} — AB (ref >59)
GFR, EST NON AFRICAN AMERICAN: 41 mL/min/{1.73_m2} — AB (ref >59)
GLUCOSE: 96 mg/dL (ref 65–99)
Globulin, Total: 2.4 g/dL (ref 1.5–4.5)
Potassium: 4.5 mmol/L (ref 3.5–5.2)
Sodium: 142 mmol/L (ref 134–144)
TOTAL PROTEIN: 6.6 g/dL (ref 6.0–8.5)

## 2016-11-22 LAB — CBC WITH DIFFERENTIAL/PLATELET
BASOS: 0 %
Basophils Absolute: 0 10*3/uL (ref 0.0–0.2)
EOS (ABSOLUTE): 0 10*3/uL (ref 0.0–0.4)
Eos: 1 %
HEMATOCRIT: 36.2 % — AB (ref 37.5–51.0)
Hemoglobin: 11.7 g/dL — ABNORMAL LOW (ref 12.6–17.7)
IMMATURE GRANS (ABS): 0 10*3/uL (ref 0.0–0.1)
IMMATURE GRANULOCYTES: 0 %
LYMPHS: 22 %
Lymphocytes Absolute: 0.9 10*3/uL (ref 0.7–3.1)
MCH: 32 pg (ref 26.6–33.0)
MCHC: 32.3 g/dL (ref 31.5–35.7)
MCV: 99 fL — AB (ref 79–97)
MONOCYTES: 11 %
Monocytes Absolute: 0.5 10*3/uL (ref 0.1–0.9)
NEUTROS ABS: 2.6 10*3/uL (ref 1.4–7.0)
Neutrophils: 66 %
PLATELETS: 90 10*3/uL — AB (ref 150–379)
RBC: 3.66 x10E6/uL — ABNORMAL LOW (ref 4.14–5.80)
RDW: 15.5 % — ABNORMAL HIGH (ref 12.3–15.4)
WBC: 4 10*3/uL (ref 3.4–10.8)

## 2016-11-22 LAB — TSH: TSH: 2.34 u[IU]/mL (ref 0.450–4.500)

## 2016-12-05 ENCOUNTER — Other Ambulatory Visit: Payer: Self-pay | Admitting: Internal Medicine

## 2016-12-12 LAB — CUP PACEART INCLINIC DEVICE CHECK
Brady Statistic AP VS Percent: 20.84 %
Brady Statistic AS VP Percent: 0.24 %
Brady Statistic RA Percent Paced: 20.78 %
Brady Statistic RV Percent Paced: 0.64 %
HIGH POWER IMPEDANCE MEASURED VALUE: 36 Ohm
HIGH POWER IMPEDANCE MEASURED VALUE: 44 Ohm
Implantable Lead Location: 753860
Implantable Lead Model: 5076
Implantable Lead Model: 6947
Lead Channel Pacing Threshold Amplitude: 1 V
Lead Channel Pacing Threshold Amplitude: 1.5 V
Lead Channel Pacing Threshold Pulse Width: 0.4 ms
Lead Channel Sensing Intrinsic Amplitude: 16.777 mV
Lead Channel Setting Sensing Sensitivity: 0.3 mV
MDC IDC LEAD IMPLANT DT: 20090313
MDC IDC LEAD IMPLANT DT: 20090313
MDC IDC LEAD LOCATION: 753859
MDC IDC MSMT BATTERY VOLTAGE: 2.64 V
MDC IDC MSMT LEADCHNL RA IMPEDANCE VALUE: 456 Ohm
MDC IDC MSMT LEADCHNL RA SENSING INTR AMPL: 2.388 mV
MDC IDC MSMT LEADCHNL RV IMPEDANCE VALUE: 344 Ohm
MDC IDC MSMT LEADCHNL RV PACING THRESHOLD PULSEWIDTH: 0.6 ms
MDC IDC PG IMPLANT DT: 20090313
MDC IDC SESS DTM: 20171128155729
MDC IDC SET LEADCHNL RA PACING AMPLITUDE: 2 V
MDC IDC SET LEADCHNL RV PACING AMPLITUDE: 3 V
MDC IDC SET LEADCHNL RV PACING PULSEWIDTH: 0.6 ms
MDC IDC STAT BRADY AP VP PERCENT: 0.34 %
MDC IDC STAT BRADY AS VS PERCENT: 78.58 %

## 2017-01-02 ENCOUNTER — Ambulatory Visit (INDEPENDENT_AMBULATORY_CARE_PROVIDER_SITE_OTHER): Payer: Medicare Other | Admitting: *Deleted

## 2017-01-02 DIAGNOSIS — Z95810 Presence of automatic (implantable) cardiac defibrillator: Secondary | ICD-10-CM

## 2017-01-02 DIAGNOSIS — I4729 Other ventricular tachycardia: Secondary | ICD-10-CM

## 2017-01-02 DIAGNOSIS — I472 Ventricular tachycardia: Secondary | ICD-10-CM

## 2017-01-02 LAB — CUP PACEART INCLINIC DEVICE CHECK
Brady Statistic AP VS Percent: 28.47 %
Brady Statistic AS VP Percent: 0.05 %
Brady Statistic RV Percent Paced: 0.12 %
HighPow Impedance: 37 Ohm
HighPow Impedance: 44 Ohm
Implantable Lead Implant Date: 20090313
Implantable Lead Location: 753859
Implantable Lead Model: 5076
Implantable Lead Model: 6947
Lead Channel Impedance Value: 336 Ohm
Lead Channel Impedance Value: 440 Ohm
Lead Channel Setting Pacing Amplitude: 2 V
Lead Channel Setting Pacing Amplitude: 3 V
MDC IDC LEAD IMPLANT DT: 20090313
MDC IDC LEAD LOCATION: 753860
MDC IDC MSMT BATTERY VOLTAGE: 2.64 V
MDC IDC PG IMPLANT DT: 20090313
MDC IDC SESS DTM: 20180109143405
MDC IDC SET LEADCHNL RV PACING PULSEWIDTH: 0.6 ms
MDC IDC SET LEADCHNL RV SENSING SENSITIVITY: 0.3 mV
MDC IDC STAT BRADY AP VP PERCENT: 0.05 %
MDC IDC STAT BRADY AS VS PERCENT: 71.43 %
MDC IDC STAT BRADY RA PERCENT PACED: 28.29 %

## 2017-01-02 NOTE — Progress Notes (Signed)
ICD battery check in clinic. No testing performed. Battery voltage 2.64V, RRT at 2.62V. 1 monitored NSVT episode--5bts. No AT/AF episodes. Patient education completed including shock plan. Patient is unable to hear alert tones. [facility] on 02/27/17 for battery check (billable).

## 2017-01-16 ENCOUNTER — Other Ambulatory Visit
Admission: RE | Admit: 2017-01-16 | Discharge: 2017-01-16 | Disposition: A | Discharge status: Home / Self Care | Payer: Medicare Other | Source: Ambulatory Visit | Attending: Family Medicine | Admitting: Family Medicine

## 2017-01-16 DIAGNOSIS — J111 Influenza due to unidentified influenza virus with other respiratory manifestations: Secondary | ICD-10-CM | POA: Insufficient documentation

## 2017-01-16 LAB — RAPID INFLUENZA A&B ANTIGENS (ARMC ONLY): INFLUENZA A (ARMC): POSITIVE — AB

## 2017-01-16 LAB — RAPID INFLUENZA A&B ANTIGENS: Influenza B (ARMC): NEGATIVE

## 2017-01-18 ENCOUNTER — Inpatient Hospital Stay
Admission: EM | Admit: 2017-01-18 | Discharge: 2017-01-25 | DRG: 296 | Disposition: E | Payer: Medicare Other | Attending: Pulmonary Disease | Admitting: Pulmonary Disease

## 2017-01-18 ENCOUNTER — Emergency Department: Payer: Medicare Other

## 2017-01-18 ENCOUNTER — Inpatient Hospital Stay: Admit: 2017-01-18 | Payer: Medicare Other

## 2017-01-18 DIAGNOSIS — Z7901 Long term (current) use of anticoagulants: Secondary | ICD-10-CM | POA: Diagnosis not present

## 2017-01-18 DIAGNOSIS — I130 Hypertensive heart and chronic kidney disease with heart failure and stage 1 through stage 4 chronic kidney disease, or unspecified chronic kidney disease: Secondary | ICD-10-CM | POA: Diagnosis present

## 2017-01-18 DIAGNOSIS — J449 Chronic obstructive pulmonary disease, unspecified: Secondary | ICD-10-CM

## 2017-01-18 DIAGNOSIS — Z8679 Personal history of other diseases of the circulatory system: Secondary | ICD-10-CM | POA: Diagnosis not present

## 2017-01-18 DIAGNOSIS — D696 Thrombocytopenia, unspecified: Secondary | ICD-10-CM | POA: Diagnosis present

## 2017-01-18 DIAGNOSIS — E872 Acidosis, unspecified: Secondary | ICD-10-CM

## 2017-01-18 DIAGNOSIS — R092 Respiratory arrest: Secondary | ICD-10-CM | POA: Diagnosis not present

## 2017-01-18 DIAGNOSIS — Z7951 Long term (current) use of inhaled steroids: Secondary | ICD-10-CM | POA: Diagnosis not present

## 2017-01-18 DIAGNOSIS — J9600 Acute respiratory failure, unspecified whether with hypoxia or hypercapnia: Secondary | ICD-10-CM

## 2017-01-18 DIAGNOSIS — J101 Influenza due to other identified influenza virus with other respiratory manifestations: Secondary | ICD-10-CM

## 2017-01-18 DIAGNOSIS — I2510 Atherosclerotic heart disease of native coronary artery without angina pectoris: Secondary | ICD-10-CM | POA: Diagnosis present

## 2017-01-18 DIAGNOSIS — Z79899 Other long term (current) drug therapy: Secondary | ICD-10-CM

## 2017-01-18 DIAGNOSIS — J9690 Respiratory failure, unspecified, unspecified whether with hypoxia or hypercapnia: Secondary | ICD-10-CM | POA: Diagnosis present

## 2017-01-18 DIAGNOSIS — I5022 Chronic systolic (congestive) heart failure: Secondary | ICD-10-CM | POA: Diagnosis present

## 2017-01-18 DIAGNOSIS — Z9981 Dependence on supplemental oxygen: Secondary | ICD-10-CM

## 2017-01-18 DIAGNOSIS — I4891 Unspecified atrial fibrillation: Secondary | ICD-10-CM | POA: Diagnosis present

## 2017-01-18 DIAGNOSIS — N179 Acute kidney failure, unspecified: Secondary | ICD-10-CM | POA: Diagnosis present

## 2017-01-18 DIAGNOSIS — Z87891 Personal history of nicotine dependence: Secondary | ICD-10-CM

## 2017-01-18 DIAGNOSIS — I420 Dilated cardiomyopathy: Secondary | ICD-10-CM | POA: Diagnosis present

## 2017-01-18 DIAGNOSIS — I469 Cardiac arrest, cause unspecified: Principal | ICD-10-CM | POA: Diagnosis present

## 2017-01-18 DIAGNOSIS — N400 Enlarged prostate without lower urinary tract symptoms: Secondary | ICD-10-CM | POA: Diagnosis present

## 2017-01-18 DIAGNOSIS — Z515 Encounter for palliative care: Secondary | ICD-10-CM | POA: Diagnosis present

## 2017-01-18 DIAGNOSIS — E1122 Type 2 diabetes mellitus with diabetic chronic kidney disease: Secondary | ICD-10-CM | POA: Diagnosis present

## 2017-01-18 DIAGNOSIS — G931 Anoxic brain damage, not elsewhere classified: Secondary | ICD-10-CM | POA: Diagnosis present

## 2017-01-18 DIAGNOSIS — R0902 Hypoxemia: Secondary | ICD-10-CM

## 2017-01-18 DIAGNOSIS — N189 Chronic kidney disease, unspecified: Secondary | ICD-10-CM | POA: Diagnosis present

## 2017-01-18 DIAGNOSIS — Z66 Do not resuscitate: Secondary | ICD-10-CM | POA: Diagnosis present

## 2017-01-18 DIAGNOSIS — I459 Conduction disorder, unspecified: Secondary | ICD-10-CM | POA: Diagnosis present

## 2017-01-18 DIAGNOSIS — Z8249 Family history of ischemic heart disease and other diseases of the circulatory system: Secondary | ICD-10-CM | POA: Diagnosis not present

## 2017-01-18 DIAGNOSIS — I517 Cardiomegaly: Secondary | ICD-10-CM | POA: Diagnosis not present

## 2017-01-18 DIAGNOSIS — J111 Influenza due to unidentified influenza virus with other respiratory manifestations: Secondary | ICD-10-CM | POA: Diagnosis present

## 2017-01-18 LAB — BLOOD GAS, ARTERIAL
Acid-base deficit: 10.3 mmol/L — ABNORMAL HIGH (ref 0.0–2.0)
Acid-base deficit: 1.3 mmol/L (ref 0.0–2.0)
Bicarbonate: 21.2 mmol/L (ref 20.0–28.0)
Bicarbonate: 27.2 mmol/L (ref 20.0–28.0)
FIO2: 1
FIO2: 60
MECHVT: 500 mL
O2 SAT: 96.1 %
O2 Saturation: 99.3 %
PATIENT TEMPERATURE: 37
PCO2 ART: 62 mmHg — AB (ref 32.0–48.0)
PEEP/CPAP: 5 cmH2O
PEEP: 5 cmH2O
PH ART: 7.06 — AB (ref 7.350–7.450)
PO2 ART: 95 mmHg (ref 83.0–108.0)
Patient temperature: 37
RATE: 16 resp/min
RATE: 16 resp/min
VT: 500 mL
pCO2 arterial: 75 mmHg (ref 32.0–48.0)
pH, Arterial: 7.25 — ABNORMAL LOW (ref 7.350–7.450)
pO2, Arterial: 198 mmHg — ABNORMAL HIGH (ref 83.0–108.0)

## 2017-01-18 LAB — CBC WITH DIFFERENTIAL/PLATELET
Basophils Absolute: 0 10*3/uL (ref 0–0.1)
Basophils Relative: 0 %
EOS ABS: 0 10*3/uL (ref 0–0.7)
EOS PCT: 0 %
HCT: 42.5 % (ref 40.0–52.0)
Hemoglobin: 13.3 g/dL (ref 13.0–18.0)
LYMPHS ABS: 1.4 10*3/uL (ref 1.0–3.6)
Lymphocytes Relative: 31 %
MCH: 31.8 pg (ref 26.0–34.0)
MCHC: 31.3 g/dL — AB (ref 32.0–36.0)
MCV: 101.6 fL — ABNORMAL HIGH (ref 80.0–100.0)
MONOS PCT: 9 %
Monocytes Absolute: 0.4 10*3/uL (ref 0.2–1.0)
Neutro Abs: 2.7 10*3/uL (ref 1.4–6.5)
Neutrophils Relative %: 60 %
PLATELETS: 65 10*3/uL — AB (ref 150–440)
RBC: 4.18 MIL/uL — ABNORMAL LOW (ref 4.40–5.90)
RDW: 15.8 % — ABNORMAL HIGH (ref 11.5–14.5)
WBC: 4.5 10*3/uL (ref 3.8–10.6)

## 2017-01-18 LAB — COMPREHENSIVE METABOLIC PANEL
ALBUMIN: 3.1 g/dL — AB (ref 3.5–5.0)
ALT: 321 U/L — AB (ref 17–63)
AST: 503 U/L — AB (ref 15–41)
Alkaline Phosphatase: 43 U/L (ref 38–126)
Anion gap: 11 (ref 5–15)
BUN: 43 mg/dL — AB (ref 6–20)
CO2: 27 mmol/L (ref 22–32)
CREATININE: 2.21 mg/dL — AB (ref 0.61–1.24)
Calcium: 8 mg/dL — ABNORMAL LOW (ref 8.9–10.3)
Chloride: 104 mmol/L (ref 101–111)
GFR calc Af Amer: 30 mL/min — ABNORMAL LOW (ref >60)
GFR calc non Af Amer: 26 mL/min — ABNORMAL LOW (ref >60)
GLUCOSE: 123 mg/dL — AB (ref 65–99)
POTASSIUM: 4.7 mmol/L (ref 3.5–5.1)
Sodium: 142 mmol/L (ref 135–145)
Total Bilirubin: 0.6 mg/dL (ref 0.3–1.2)
Total Protein: 5.8 g/dL — ABNORMAL LOW (ref 6.5–8.1)

## 2017-01-18 LAB — TROPONIN I
TROPONIN I: 0.43 ng/mL — AB (ref <0.03)
Troponin I: 0.61 ng/mL (ref <0.03)
Troponin I: 1.92 ng/mL (ref <0.03)

## 2017-01-18 LAB — LACTIC ACID, PLASMA
Lactic Acid, Venous: 5.1 mmol/L (ref 0.5–1.9)
Lactic Acid, Venous: 2.1 mmol/L (ref 0.5–1.9)

## 2017-01-18 LAB — GLUCOSE, CAPILLARY
GLUCOSE-CAPILLARY: 174 mg/dL — AB (ref 65–99)
Glucose-Capillary: 169 mg/dL — ABNORMAL HIGH (ref 65–99)
Glucose-Capillary: 220 mg/dL — ABNORMAL HIGH (ref 65–99)

## 2017-01-18 LAB — MRSA PCR SCREENING: MRSA BY PCR: POSITIVE — AB

## 2017-01-18 MED ORDER — SODIUM CHLORIDE 0.9 % IV BOLUS (SEPSIS)
1000.0000 mL | Freq: Once | INTRAVENOUS | Status: AC
  Administered 2017-01-18: 1000 mL via INTRAVENOUS

## 2017-01-18 MED ORDER — FENTANYL BOLUS VIA INFUSION
25.0000 ug | INTRAVENOUS | Status: DC | PRN
  Filled 2017-01-18: qty 25

## 2017-01-18 MED ORDER — HEPARIN SODIUM (PORCINE) 5000 UNIT/ML IJ SOLN
5000.0000 [IU] | Freq: Three times a day (TID) | INTRAMUSCULAR | Status: DC
  Administered 2017-01-18 – 2017-01-19 (×2): 5000 [IU] via SUBCUTANEOUS
  Filled 2017-01-18 (×3): qty 1

## 2017-01-18 MED ORDER — FENTANYL CITRATE (PF) 100 MCG/2ML IJ SOLN
INTRAMUSCULAR | Status: AC
  Filled 2017-01-18: qty 2

## 2017-01-18 MED ORDER — MUPIROCIN 2 % EX OINT
1.0000 "application " | TOPICAL_OINTMENT | Freq: Two times a day (BID) | CUTANEOUS | Status: DC
  Administered 2017-01-19 – 2017-01-21 (×4): 1 via NASAL
  Filled 2017-01-18: qty 22

## 2017-01-18 MED ORDER — ORAL CARE MOUTH RINSE
15.0000 mL | Freq: Four times a day (QID) | OROMUCOSAL | Status: DC
  Administered 2017-01-19 – 2017-01-20 (×7): 15 mL via OROMUCOSAL

## 2017-01-18 MED ORDER — FENTANYL 2500MCG IN NS 250ML (10MCG/ML) PREMIX INFUSION
25.0000 ug/h | INTRAVENOUS | Status: DC
  Administered 2017-01-18: 50 ug/h via INTRAVENOUS
  Filled 2017-01-18: qty 250

## 2017-01-18 MED ORDER — CHLORHEXIDINE GLUCONATE 0.12% ORAL RINSE (MEDLINE KIT)
15.0000 mL | Freq: Two times a day (BID) | OROMUCOSAL | Status: DC
  Administered 2017-01-18 – 2017-01-20 (×4): 15 mL via OROMUCOSAL

## 2017-01-18 MED ORDER — INSULIN ASPART 100 UNIT/ML ~~LOC~~ SOLN
0.0000 [IU] | SUBCUTANEOUS | Status: DC
  Administered 2017-01-18: 5 [IU] via SUBCUTANEOUS
  Administered 2017-01-18 – 2017-01-19 (×3): 3 [IU] via SUBCUTANEOUS
  Administered 2017-01-19: 5 [IU] via SUBCUTANEOUS
  Filled 2017-01-18 (×3): qty 3
  Filled 2017-01-18 (×2): qty 5

## 2017-01-18 MED ORDER — MIDAZOLAM HCL 2 MG/2ML IJ SOLN: 1.0000 mg | INTRAMUSCULAR | Status: DC | PRN

## 2017-01-18 MED ORDER — MIDAZOLAM HCL 2 MG/2ML IJ SOLN
2.0000 mg | Freq: Once | INTRAMUSCULAR | Status: AC
  Administered 2017-01-18: 2 mg via INTRAVENOUS

## 2017-01-18 MED ORDER — FENTANYL CITRATE (PF) 100 MCG/2ML IJ SOLN
50.0000 ug | INTRAMUSCULAR | Status: DC | PRN
  Filled 2017-01-18: qty 2

## 2017-01-18 MED ORDER — SODIUM CHLORIDE 0.9 % IV SOLN: 250.0000 mL | INTRAVENOUS | Status: DC | PRN

## 2017-01-18 MED ORDER — FAMOTIDINE IN NACL 20-0.9 MG/50ML-% IV SOLN
20.0000 mg | INTRAVENOUS | Status: DC
  Administered 2017-01-18: 20 mg via INTRAVENOUS
  Filled 2017-01-18: qty 50

## 2017-01-18 MED ORDER — FENTANYL CITRATE (PF) 100 MCG/2ML IJ SOLN: 50.0000 ug | Freq: Once | INTRAMUSCULAR | Status: DC

## 2017-01-18 MED ORDER — DOCUSATE SODIUM 50 MG/5ML PO LIQD: 100.0000 mg | Freq: Two times a day (BID) | ORAL | Status: DC | PRN

## 2017-01-18 MED ORDER — FENTANYL CITRATE (PF) 100 MCG/2ML IJ SOLN: 50.0000 ug | INTRAMUSCULAR | Status: DC | PRN

## 2017-01-18 MED ORDER — IPRATROPIUM-ALBUTEROL 0.5-2.5 (3) MG/3ML IN SOLN
3.0000 mL | Freq: Four times a day (QID) | RESPIRATORY_TRACT | Status: DC
  Administered 2017-01-18 – 2017-01-21 (×10): 3 mL via RESPIRATORY_TRACT
  Filled 2017-01-18 (×9): qty 3

## 2017-01-18 MED ORDER — BISACODYL 10 MG RE SUPP: 10.0000 mg | Freq: Every day | RECTAL | Status: DC | PRN

## 2017-01-18 MED ORDER — SUCCINYLCHOLINE CHLORIDE 20 MG/ML IJ SOLN
100.0000 mg | Freq: Once | INTRAMUSCULAR | Status: AC
  Administered 2017-01-18: 100 mg via INTRAVENOUS

## 2017-01-18 MED ORDER — ASPIRIN 325 MG PO TABS
325.0000 mg | ORAL_TABLET | Freq: Every day | ORAL | Status: DC
  Administered 2017-01-18 – 2017-01-21 (×3): 325 mg via ORAL
  Filled 2017-01-18 (×3): qty 1

## 2017-01-18 MED ORDER — SODIUM BICARBONATE 8.4 % IV SOLN
100.0000 meq | Freq: Once | INTRAVENOUS | Status: AC
  Administered 2017-01-18: 100 meq via INTRAVENOUS

## 2017-01-18 MED ORDER — ALBUTEROL SULFATE (2.5 MG/3ML) 0.083% IN NEBU: 2.5000 mg | INHALATION_SOLUTION | RESPIRATORY_TRACT | Status: DC | PRN

## 2017-01-18 MED ORDER — DEXTROSE 5 % IV SOLN
0.0000 ug/min | INTRAVENOUS | Status: DC
  Administered 2017-01-18: 16 ug/min via INTRAVENOUS
  Administered 2017-01-18 (×2): 20 ug/min via INTRAVENOUS
  Filled 2017-01-18 (×3): qty 4

## 2017-01-18 MED ORDER — DEXTROSE-NACL 5-0.45 % IV SOLN
INTRAVENOUS | Status: DC
  Administered 2017-01-18 – 2017-01-19 (×2): via INTRAVENOUS

## 2017-01-18 MED ORDER — NOREPINEPHRINE BITARTRATE 1 MG/ML IV SOLN
0.0000 ug/min | Freq: Once | INTRAVENOUS | Status: AC
  Administered 2017-01-18: 20 ug/min via INTRAVENOUS
  Administered 2017-01-18: 40 ug/min via INTRAVENOUS
  Filled 2017-01-18: qty 4

## 2017-01-18 MED ORDER — MIDAZOLAM HCL 2 MG/2ML IJ SOLN
INTRAMUSCULAR | Status: AC
  Filled 2017-01-18: qty 2

## 2017-01-18 MED ORDER — CHLORHEXIDINE GLUCONATE CLOTH 2 % EX PADS
6.0000 | MEDICATED_PAD | Freq: Every day | CUTANEOUS | Status: DC
  Administered 2017-01-19 – 2017-01-21 (×2): 6 via TOPICAL

## 2017-01-18 MED ORDER — ETOMIDATE 2 MG/ML IV SOLN
20.0000 mg | Freq: Once | INTRAVENOUS | Status: AC
  Administered 2017-01-18: 20 mg via INTRAVENOUS

## 2017-01-18 MED ORDER — FENTANYL CITRATE (PF) 100 MCG/2ML IJ SOLN
100.0000 ug | Freq: Once | INTRAMUSCULAR | Status: AC
  Administered 2017-01-18: 100 ug via INTRAVENOUS

## 2017-01-18 MED ORDER — SODIUM BICARBONATE 8.4 % IV SOLN
INTRAVENOUS | Status: AC
  Administered 2017-01-18: 100 meq via INTRAVENOUS
  Filled 2017-01-18: qty 100

## 2017-01-18 MED ORDER — MIDAZOLAM HCL 2 MG/2ML IJ SOLN
1.0000 mg | INTRAMUSCULAR | Status: DC | PRN
  Administered 2017-01-19 (×3): 1 mg via INTRAVENOUS
  Filled 2017-01-18 (×3): qty 2

## 2017-01-18 MED ORDER — NOREPINEPHRINE BITARTRATE 1 MG/ML IV SOLN
0.0000 ug/min | Freq: Once | INTRAVENOUS | Status: DC
  Filled 2017-01-18: qty 4

## 2017-01-18 MED ORDER — ACETAMINOPHEN 325 MG PO TABS
650.0000 mg | ORAL_TABLET | ORAL | Status: DC | PRN
  Administered 2017-01-21: 650 mg via ORAL
  Filled 2017-01-18: qty 2
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
## 2017-01-18 NOTE — Progress Notes (Signed)
eLink Physician-Brief Progress Note Patient Name: [name] DOB: 11-04-1930 MRN: [id number]   Date of Service  12/29/2016  HPI/Events of Note  Severe copd/chf post arrest/ vent dep and back on low dose  levophed for soft bps  eICU Interventions  No additional interventions indicated at this point, prognosis for functional recovery quite poor     Intervention Category Major Interventions: Respiratory failure - evaluation and management  Sandrea HughsMichael Quantina Dershem 01/09/2017, 6:41 PM
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
## 2017-01-18 NOTE — ED Notes (Signed)
CCU MD at bedside.

## 2017-01-18 NOTE — Progress Notes (Signed)
Robert Harmanana, NP made aware of Lactic of 2.1 and Troponin of 1.92. No new orders at this time.

## 2017-01-18 NOTE — ED Triage Notes (Signed)
Pt was at [facility] and had witness arrest. 2 shock via AED by staff at [facility] before EMS arrival. EMS gave 4 mg epi CPR. Pt was showing pacer spike without pulse, PEA rhythm. IO right tib/fib and 20 gauge iv left FA by EMS. Blood glucose 144 by ems. Size 4 king airway by EMS. Pt was known flu positive

## 2017-01-19 ENCOUNTER — Inpatient Hospital Stay: Admit: 2017-01-19 | Payer: Medicare Other

## 2017-01-19 ENCOUNTER — Inpatient Hospital Stay: Payer: Medicare Other

## 2017-01-19 ENCOUNTER — Inpatient Hospital Stay (HOSPITAL_COMMUNITY)
Admit: 2017-01-19 | Discharge: 2017-01-19 | Disposition: A | Discharge status: Home / Self Care | Payer: Medicare Other | Attending: Pulmonary Disease | Admitting: Pulmonary Disease

## 2017-01-19 DIAGNOSIS — I517 Cardiomegaly: Secondary | ICD-10-CM

## 2017-01-19 DIAGNOSIS — R0902 Hypoxemia: Secondary | ICD-10-CM

## 2017-01-19 LAB — CBC
HEMATOCRIT: 35.6 % — AB (ref 40.0–52.0)
Hemoglobin: 11.7 g/dL — ABNORMAL LOW (ref 13.0–18.0)
MCH: 32.4 pg (ref 26.0–34.0)
MCHC: 32.9 g/dL (ref 32.0–36.0)
MCV: 98.5 fL (ref 80.0–100.0)
Platelets: 51 10*3/uL — ABNORMAL LOW (ref 150–440)
RBC: 3.61 MIL/uL — ABNORMAL LOW (ref 4.40–5.90)
RDW: 15.1 % — AB (ref 11.5–14.5)
WBC: 6.5 10*3/uL (ref 3.8–10.6)

## 2017-01-19 LAB — URINE CULTURE: CULTURE: NO GROWTH

## 2017-01-19 LAB — GLUCOSE, CAPILLARY
GLUCOSE-CAPILLARY: 143 mg/dL — AB (ref 65–99)
Glucose-Capillary: 167 mg/dL — ABNORMAL HIGH (ref 65–99)
Glucose-Capillary: 222 mg/dL — ABNORMAL HIGH (ref 65–99)
Glucose-Capillary: 133 mg/dL — ABNORMAL HIGH (ref 65–99)

## 2017-01-19 LAB — TROPONIN I
TROPONIN I: 1.52 ng/mL — AB (ref <0.03)
TROPONIN I: 0.93 ng/mL — AB (ref <0.03)
Troponin I: 1.92 ng/mL (ref <0.03)
Troponin I: 0.81 ng/mL (ref <0.03)

## 2017-01-19 LAB — BASIC METABOLIC PANEL
ANION GAP: 7 (ref 5–15)
BUN: 56 mg/dL — ABNORMAL HIGH (ref 6–20)
CALCIUM: 7.6 mg/dL — AB (ref 8.9–10.3)
CO2: 27 mmol/L (ref 22–32)
CREATININE: 2.54 mg/dL — AB (ref 0.61–1.24)
Chloride: 101 mmol/L (ref 101–111)
GFR, EST AFRICAN AMERICAN: 25 mL/min — AB (ref >60)
GFR, EST NON AFRICAN AMERICAN: 22 mL/min — AB (ref >60)
Glucose, Bld: 276 mg/dL — ABNORMAL HIGH (ref 65–99)
Potassium: 3.4 mmol/L — ABNORMAL LOW (ref 3.5–5.1)
SODIUM: 135 mmol/L (ref 135–145)

## 2017-01-19 LAB — ECHOCARDIOGRAM COMPLETE
HEIGHTINCHES: 69 in
Weight: 3037.06 oz

## 2017-01-19 MED ORDER — SODIUM CHLORIDE 0.45 % IV SOLN
INTRAVENOUS | Status: DC
  Administered 2017-01-19 – 2017-01-20 (×2): via INTRAVENOUS

## 2017-01-19 MED ORDER — SODIUM CHLORIDE 0.9 % IV SOLN
INTRAVENOUS | Status: DC
  Filled 2017-01-19: qty 2.5

## 2017-01-19 MED ORDER — FENTANYL CITRATE (PF) 100 MCG/2ML IJ SOLN
100.0000 ug | INTRAMUSCULAR | Status: DC | PRN
  Administered 2017-01-19: 100 ug via INTRAVENOUS

## 2017-01-19 MED ORDER — STERILE WATER FOR INJECTION IJ SOLN
INTRAMUSCULAR | Status: AC
  Filled 2017-01-19: qty 10

## 2017-01-19 MED ORDER — VECURONIUM BROMIDE 10 MG IV SOLR: 10.0000 mg | Freq: Once | INTRAVENOUS | Status: DC

## 2017-01-19 MED ORDER — DEXTROSE 5 % IV SOLN
0.0000 ug/min | INTRAVENOUS | Status: DC
  Administered 2017-01-19: 20 ug/min via INTRAVENOUS
  Administered 2017-01-19: 18 ug/min via INTRAVENOUS
  Administered 2017-01-21: 10 ug/min via INTRAVENOUS
  Filled 2017-01-19 (×4): qty 16

## 2017-01-19 MED ORDER — POTASSIUM CHLORIDE 20 MEQ PO PACK
40.0000 meq | PACK | Freq: Once | ORAL | Status: AC
  Administered 2017-01-19: 40 meq via ORAL
  Filled 2017-01-19: qty 2

## 2017-01-19 MED ORDER — FAMOTIDINE 20 MG PO TABS
20.0000 mg | ORAL_TABLET | Freq: Every day | ORAL | Status: DC
  Administered 2017-01-19 – 2017-01-20 (×2): 20 mg via ORAL
  Filled 2017-01-19 (×2): qty 1

## 2017-01-19 MED ORDER — VECURONIUM BROMIDE 10 MG IV SOLR
INTRAVENOUS | Status: AC
  Filled 2017-01-19: qty 10
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
## 2017-01-19 NOTE — Progress Notes (Signed)
Initial Nutrition Assessment  DOCUMENTATION CODES:   Not applicable  INTERVENTION:  -Discussed nutrition poc during ICU rounds with MD Kasa, per MD orders will hold on initiation of enteral nutrition at present. Awaiting further determination regarding poc. If aggressive intervention waranted, recommend initiation of TF. Recommended TF regimen: Vital AF 1.2 at rate of 60 ml/hr providing 127 g of protein, 1728 kcals and 1166 mL of free water. Continue to assess   NUTRITION DIAGNOSIS:   Inadequate oral intake related to acute illness as evidenced by NPO status.  GOAL:   Provide needs based on ASPEN/SCCM guidelines  MONITOR:   TF tolerance, Vent status, Labs, Weight trends  REASON FOR ASSESSMENT:   Ventilator    ASSESSMENT:   83 yo male admitted s/p PEA arrest, requiring vent post arrest with hx of severe COPD and CHF with EF of 20-25%, AKI with hx of CKD  Patient is currently intubated on ventilator support MV: 7.9 L/min Temp (24hrs), Avg:98.2 F (36.8 C), Min:90.7 F (32.6 C), Max:99.5 F (37.5 C)  Labs: potassium 3.4 Meds: D5-1/2 NS at 100 ml/hr  Diet Order:   NPO  Skin:  Reviewed, no issues  Last BM:  no documented BM  Height:   Ht Readings from Last 1 Encounters:  12/03/2017 5\' 9"  (1.753 m)    Weight:   Wt Readings from Last 1 Encounters:  01/19/17 189 lb 13.1 oz (86.1 kg)   BMI:  Body mass index is 28.03 kg/m.  Estimated Nutritional Needs:   Kcal:  1770 kcals  Protein:  103-129 g  Fluid:  >/= 1.7 L  EDUCATION NEEDS:   No education needs identified at this time  Romelle StarcherCate Penney Domanski MS, RD, LDN [phone] Pager  [phone] Weekend/On-Call Pager

## 2017-01-19 NOTE — Progress Notes (Signed)
Elink paged to question tremor/myoclonic activity. Orders given to increase Fent drip and give versed pushes.

## 2017-01-19 NOTE — Progress Notes (Signed)
Elink notified of platelet count of 51. No new orders at this time.

## 2017-01-19 NOTE — Progress Notes (Signed)
After further evaluation, patient with signs of anoxic brain injury, patient with end stage COPD and CHF. I have talked with [place], [place], [phone] and she understands the grave prognosis as she used to be a Engineer, civil (consulting)nurse.  She has agreed and consented to DNR status and if he is has severe brain damage, she has agreed for comfort care measures  Plan: 1.place DNR orders 2.await final neuro recs   Lucie LeatherKurian David Clarivel Callaway, M.D.  [facility]  Medical Director [facility] Medical Director [facility]

## 2017-01-19 NOTE — Clinical Social Work Note (Signed)
CSW aware that patient is from [facility]. Patient's condition is currently very poor. Family potentially is going to make patient comfort care. Assessment to be completed with family at a more appropriate time. Patient currently on ventilator.

## 2017-01-19 NOTE — Progress Notes (Signed)
Received handoff report from [place], [place] at [phone]. Pt in stable condition at this time. No signs of pain according to the CPOT scale. Pt continues to have bloody oral secretions. Full assessment in EPIC. Report given to [place], Charity fundraiserN.

## 2017-01-19 NOTE — Progress Notes (Signed)
MEDICATION RELATED CONSULT NOTE    Pharmacy Consult for electrolyte management.   Pharmacy consulted to for electrolyte management for 83 yo male ICU patient requiring mechanical ventilation.   Plan:   Will order potassium 40mEq PO x 1. Will recheck electrolytes with am labs.    Allergies  Allergen Reactions  . Brimonidine   . Levaquin [Levofloxacin]     Patient Measurements: Height: 5\' 9"  (175.3 cm) Weight: 189 lb 13.1 oz (86.1 kg) IBW/kg (Calculated) : 70.7  Vital Signs: Temp: 100 F (37.8 C) (01/26 1500) Temp Source: Core (Comment) (01/26 1200) BP: 125/79 (01/26 1500) Pulse Rate: 83 (01/26 1500) Intake/Output from previous day: 01/25 0701 - 01/26 0700 In: 2172.1 [I.V.:2072.1; NG/GT:50; IV Piggyback:50] Out: 300 [Urine:300] Intake/Output from this shift: Total I/O In: 1397.4 [I.V.:1397.4] Out: 340 [Urine:340]  Labs:  Recent Labs  16-Jun-2017 1246 01/19/17 0229  WBC 4.5 6.5  HGB 13.3 11.7*  HCT 42.5 35.6*  PLT 65* 51*  CREATININE 2.21* 2.54*  ALBUMIN 3.1*  --   PROT 5.8*  --   AST 503*  --   ALT 321*  --   ALKPHOS 43  --   BILITOT 0.6  --    Estimated Creatinine Clearance: 23.5 mL/min (by C-G formula based on SCr of 2.54 mg/dL (H)).  Pharmacy will continue to monitor and adjust per consult.   Daisean Brodhead L 01/19/2017,4:36 PM

## 2017-01-20 ENCOUNTER — Inpatient Hospital Stay: Payer: Medicare Other

## 2017-01-20 LAB — GLUCOSE, CAPILLARY
GLUCOSE-CAPILLARY: 107 mg/dL — AB (ref 65–99)
GLUCOSE-CAPILLARY: 118 mg/dL — AB (ref 65–99)
GLUCOSE-CAPILLARY: 125 mg/dL — AB (ref 65–99)
GLUCOSE-CAPILLARY: 145 mg/dL — AB (ref 65–99)
Glucose-Capillary: 123 mg/dL — ABNORMAL HIGH (ref 65–99)
Glucose-Capillary: 124 mg/dL — ABNORMAL HIGH (ref 65–99)
Glucose-Capillary: 133 mg/dL — ABNORMAL HIGH (ref 65–99)
Glucose-Capillary: 165 mg/dL — ABNORMAL HIGH (ref 65–99)

## 2017-01-20 LAB — CBC
HEMATOCRIT: 37.9 % — AB (ref 40.0–52.0)
HEMOGLOBIN: 12.5 g/dL — AB (ref 13.0–18.0)
MCH: 31.6 pg (ref 26.0–34.0)
MCHC: 32.9 g/dL (ref 32.0–36.0)
MCV: 96.1 fL (ref 80.0–100.0)
Platelets: 54 10*3/uL — ABNORMAL LOW (ref 150–440)
RBC: 3.95 MIL/uL — ABNORMAL LOW (ref 4.40–5.90)
RDW: 15.4 % — AB (ref 11.5–14.5)
WBC: 6.4 10*3/uL (ref 3.8–10.6)

## 2017-01-20 LAB — PHOSPHORUS: PHOSPHORUS: 3.1 mg/dL (ref 2.5–4.6)

## 2017-01-20 LAB — BASIC METABOLIC PANEL
Anion gap: 5 (ref 5–15)
BUN: 52 mg/dL — AB (ref 6–20)
CHLORIDE: 103 mmol/L (ref 101–111)
CO2: 27 mmol/L (ref 22–32)
CREATININE: 2.76 mg/dL — AB (ref 0.61–1.24)
Calcium: 7.8 mg/dL — ABNORMAL LOW (ref 8.9–10.3)
GFR calc Af Amer: 23 mL/min — ABNORMAL LOW (ref >60)
GFR calc non Af Amer: 20 mL/min — ABNORMAL LOW (ref >60)
Glucose, Bld: 162 mg/dL — ABNORMAL HIGH (ref 65–99)
POTASSIUM: 3.9 mmol/L (ref 3.5–5.1)
Sodium: 135 mmol/L (ref 135–145)

## 2017-01-20 LAB — LACTIC ACID, PLASMA: Lactic Acid, Venous: 1.7 mmol/L (ref 0.5–1.9)

## 2017-01-20 LAB — PROCALCITONIN: Procalcitonin: 7.63 ng/mL

## 2017-01-20 LAB — MAGNESIUM: Magnesium: 2 mg/dL (ref 1.7–2.4)

## 2017-01-20 MED ORDER — SODIUM CHLORIDE 0.9 % IV SOLN
INTRAVENOUS | Status: DC
  Administered 2017-01-20 – 2017-01-21 (×2): via INTRAVENOUS
  Filled 2017-01-20 (×2): qty 50

## 2017-01-20 MED ORDER — CHLORHEXIDINE GLUCONATE 0.12% ORAL RINSE (MEDLINE KIT)
15.0000 mL | Freq: Two times a day (BID) | OROMUCOSAL | Status: DC
  Administered 2017-01-20 – 2017-01-21 (×2): 15 mL via OROMUCOSAL

## 2017-01-20 MED ORDER — ORAL CARE MOUTH RINSE
15.0000 mL | OROMUCOSAL | Status: DC
  Administered 2017-01-20 – 2017-01-21 (×8): 15 mL via OROMUCOSAL
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
## 2017-01-20 NOTE — Progress Notes (Signed)
Pt remains on vent, no purposeful movements.  Pt only opens eyes, coughs, and bites ETT to stimulation.  NSR with BBB. Levophed gtt titrated down to 10 mcg, fentanyl gtt at 100 mcg.  Vital signs stable.  Report given to [facility], RN receiving pt on night shift.

## 2017-01-20 NOTE — Progress Notes (Signed)
Patient with secure ET tube. Bite block removed for oral care and repositioning. RN able to do tongue care at this time. Bite block reinserted in the exact same place due to intense tongue swelling in other areas from where patient has bitten tongue. Able to move et tube somewhat in center of mouth from the left due to bite block.  Patient tolerated well.n

## 2017-01-20 NOTE — H&P (Signed)
PULMONARY / CRITICAL CARE MEDICINE   Name: [name] MRN: [id number] DOB: 1930/05/28    ADMISSION DATE:  01/24/2017   PT PROFILE:   52M NH resident with multiple medical problems including severe COPD, dilated cardiomyopathy recently diagnosed with influenza and started on oseltamivir suffered cardiac arrest at [facility] with no antecedent symptoms reported. Initial rhythm PEA. Total of approx 30 mins prior to ROSC  MAJOR EVENTS/TEST RESULTS: 01/04/2017: Admitted via ED after out of hospital cardiac arrests 1/26 DNR placed,awating neuro consult  INDWELLING DEVICES:: ETT 01/05/2017 >>  R femoral CVL 12/29/2016 >>     HISTORY OF PRESENT ILLNESS:   Intubated,sedated On vent Remains critically ill, findings concerning for anoxic brain injruy H/o COPD and CHF +tongue biting Prognosis is very poor     REVIEW OF SYSTEMS:   Obtunded and critically ill, unable to obtain ROS     VITAL SIGNS: BP 117/69   Pulse 68   Temp 99.1 F (37.3 C) (Core (Comment)) Comment (Src): foley  Resp 16   Ht 5\' 9"  (1.753 m)   Wt 182 lb 5.1 oz (82.7 kg)   SpO2 95%   BMI 26.92 kg/m       VENTILATOR SETTINGS: Vent Mode: PRVC FiO2 (%):  [30 %] 30 % Set Rate:  [16 bmp] 16 bmp Vt Set:  [500 mL] 500 mL PEEP:  [5 cmH20] 5 cmH20 Plateau Pressure:  [20 cmH20] 20 cmH20  INTAKE / OUTPUT: I/O last 3 completed shifts: In: 4447.6 [I.V.:4347.6; NG/GT:50; IV Piggyback:50] Out: 1840 [Urine:1590; Other:250]  PHYSICAL EXAMINATION: General: intubated, RASS -2 Neuro: Pupils 4 mm and symmetric, corneal reflexes absent, no spont movement, no withdrawal from pain, DTRs absent HEENT: NCAT, ETT present Cardiovascular: bradycardic, regular, no M noted Lungs: distant BS, slightly prolonged expiratory phase, very distant wheezes Abdomen: soft, NT, no BS noted Ext: trace to 1+ bilateral pretibial edema, R intraosseus line, severe R great toe deformity Skin: burn on chest from DCCV  LABS:  BMET  Recent  Labs Lab 01/22/2017 1246 01/19/17 0229 01/20/17 0437  NA 142 135 135  K 4.7 3.4* 3.9  CL 104 101 103  CO2 27 27 27   BUN 43* 56* 52*  CREATININE 2.21* 2.54* 2.76*  GLUCOSE 123* 276* 162*    Electrolytes  Recent Labs Lab 01/08/2017 1246 01/19/17 0229 01/20/17 0437  CALCIUM 8.0* 7.6* 7.8*  MG  --   --  2.0  PHOS  --   --  3.1    CBC  Recent Labs Lab 01/24/2017 1246 01/19/17 0229 01/20/17 0437  WBC 4.5 6.5 6.4  HGB 13.3 11.7* 12.5*  HCT 42.5 35.6* 37.9*  PLT 65* 51* 54*    Coag's No results for input(s): APTT, INR in the last 168 hours.  Sepsis Markers  Recent Labs Lab 01/09/2017 1246 01/19/2017 1836 01/20/17 0208 01/20/17 0437  LATICACIDVEN 5.1* 2.1*  --  1.7  PROCALCITON  --   --  7.63  --     ABG  Recent Labs Lab 01/15/2017 1246 01/15/2017 1721  PHART 7.06* 7.25*  PCO2ART 75* 62*  PO2ART 198* 95    Liver Enzymes  Recent Labs Lab 01/04/2017 1246  AST 503*  ALT 321*  ALKPHOS 43  BILITOT 0.6  ALBUMIN 3.1*    Cardiac Enzymes  Recent Labs Lab 01/19/17 0827 01/19/17 1704 01/19/17 2030  TROPONINI 1.52* 0.93* 0.81*    Glucose  Recent Labs Lab 01/19/17 1119 01/19/17 1710 01/19/17 2017 01/20/17 0029 01/20/17 0408 01/20/17 0739  GLUCAP 222*  143* 133* 125* 145* 123*    CXR: CM, no edema or infiltrates EKG: NS IVCD  ASSESSMENT / PLAN:  PULMONARY A: Severe COPD @ baseline - prior PFTs from 2015 reveal FEV1 25% Vent dependence after cardiac arrest P:   Continue vent support Vent bundle implemented Scheduled and PRN bronchodilators  CARDIOVASCULAR A:  Dilated CM - echocardiogram 2015 with LVEF 20-25% Cardiac arrest Prior history of LBBB Intraventricular conduction delay Cardiogenic shock Elevated lactate - not due to sepsis P:  Norepinephrine to maintain MAP > 65 mmHg Cardiology consult requested (Has seen Gollan in past) No indication for repeat lactate measurements  RENAL A:   AKI  CKD, NOS P:   Monitor BMET  intermittently Monitor I/Os Correct electrolytes as indicated Maintenance IVFs  GASTROINTESTINAL A:   No issues P:   SUP: IV famotidine -TFs 01/19/17  HEMATOLOGIC A:   Mild thrombocytopenia (acute on chronic) P:  DVT px: SQ heparin Monitor CBC intermittently Transfuse per usual guidelines Monitor plts closely while on SQ heparin  INFECTIOUS A:   Recent dx of influenza No evidence of bacterial infection P:   Monitor temp, WBC count Micro and abx as above  ENDOCRINE A:   DM 2 P:   Mod scale SSI q 4 hrs  NEUROLOGIC A:   Post anoxic encephalopathy - prognosis guarded P:   RASS goal: -1, -2 PAD protocol - intermittent fent and midaz   FAMILY UPDATE: 1/26 [place]  I have personally obtained a history, examined the patient, evaluated Pertinent laboratory and RadioGraphic/imaging results, and  formulated the assessment and plan   The Patient requires high complexity decision making for assessment and support, frequent evaluation and titration of therapies, application of advanced monitoring technologies and extensive interpretation of multiple databases. Critical Care Time devoted to patient care services described in this note is 34 minutes.   Overall, patient is critically ill, prognosis is guarded.  Patient with Multiorgan failure and at high risk for cardiac arrest and death.    Findings concerning for anoxic brain injury in setting of multiorgan failure Patient now DNR, awaiting Neuro consult and assess for Comfort care measures  Angelin Cutrone Santiago Gladavid Jacelyn Cuen, M.D.  [facility]  Medical Director [facility] Medical Director [facility] Cardio-Pulmonary Department

## 2017-01-21 ENCOUNTER — Inpatient Hospital Stay: Payer: Medicare Other

## 2017-01-21 LAB — BASIC METABOLIC PANEL
Anion gap: 7 (ref 5–15)
BUN: 54 mg/dL — ABNORMAL HIGH (ref 6–20)
CALCIUM: 7.8 mg/dL — AB (ref 8.9–10.3)
CHLORIDE: 102 mmol/L (ref 101–111)
CO2: 25 mmol/L (ref 22–32)
CREATININE: 2.62 mg/dL — AB (ref 0.61–1.24)
GFR calc Af Amer: 24 mL/min — ABNORMAL LOW (ref >60)
GFR calc non Af Amer: 21 mL/min — ABNORMAL LOW (ref >60)
GLUCOSE: 122 mg/dL — AB (ref 65–99)
Potassium: 4.2 mmol/L (ref 3.5–5.1)
Sodium: 134 mmol/L — ABNORMAL LOW (ref 135–145)

## 2017-01-21 LAB — GLUCOSE, CAPILLARY
Glucose-Capillary: 115 mg/dL — ABNORMAL HIGH (ref 65–99)
Glucose-Capillary: 98 mg/dL (ref 65–99)
Glucose-Capillary: 106 mg/dL — ABNORMAL HIGH (ref 65–99)

## 2017-01-21 LAB — HEPARIN INDUCED PLATELET AB (HIT ANTIBODY): HEPARIN INDUCED PLT AB: 0.292 {OD_unit} (ref 0.000–0.400)

## 2017-01-21 LAB — PROCALCITONIN: PROCALCITONIN: 3.25 ng/mL

## 2017-01-21 MED ORDER — LORAZEPAM 2 MG/ML IJ SOLN
2.0000 mg | INTRAMUSCULAR | Status: DC | PRN
Start: 2017-01-21 — End: 2017-01-21

## 2017-01-21 MED ORDER — MORPHINE 100MG IN NS 100ML (1MG/ML) PREMIX INFUSION
0.0000 mg/h | INTRAVENOUS | Status: DC
  Administered 2017-01-21: 4 mg/h via INTRAVENOUS
  Filled 2017-01-21: qty 100

## 2017-01-21 MED ORDER — SCOPOLAMINE 1 MG/3DAYS TD PT72
1.0000 | MEDICATED_PATCH | TRANSDERMAL | Status: DC
  Administered 2017-01-21: 1.5 mg via TRANSDERMAL
  Filled 2017-01-21: qty 1

## 2017-01-21 MED ORDER — SODIUM CHLORIDE 0.9 % IV SOLN: 0.0000 mg/h | INTRAVENOUS | Status: DC

## 2017-01-22 MED FILL — Fentanyl Citrate Preservative Free (PF) Inj 2500 MCG/50ML: INTRAMUSCULAR | Qty: 50 | Status: AC

## 2017-01-23 LAB — CULTURE, BLOOD (ROUTINE X 2)
Culture: NO GROWTH
Culture: NO GROWTH

## 2017-01-25 NOTE — Progress Notes (Signed)
[name] with [name] called reference organ donation. Pt is not a candidate for organ donation.

## 2017-01-25 NOTE — Progress Notes (Signed)
Upon arrival Pt. was not responsive Chaplain is here to provide any spiritual and emotional support via prayer.

## 2017-01-25 NOTE — Progress Notes (Signed)
Pt extubated without complications, no stridor noted.  

## 2017-01-25 NOTE — Progress Notes (Signed)
PULMONARY / CRITICAL CARE MEDICINE   Name: [name] MRN: [id number] DOB: Jan 13, 1930    ADMISSION DATE:  23-Jun-2017   PT PROFILE:   81M NH resident with multiple medical problems including severe COPD, dilated cardiomyopathy recently diagnosed with influenza and started on oseltamivir [facility] symptoms reported. Initial rhythm PEA. Total of approx 30 mins prior to ROSC  MAJOR EVENTS/TEST RESULTS: Nov 30, 2017: Admitted via ED after out of hospital cardiac arrests 1/26 DNR placed,awating neuro consult  INDWELLING DEVICES:: ETT Nov 30, 2017 >>  R femoral CVL Nov 30, 2017 >>     HISTORY OF PRESENT ILLNESS:   Intubated,sedated On vent Remains critically ill, findings concerning for anoxic brain injruy H/o COPD and CHF +tongue biting Prognosis is very poor awaiting for CT / MRI      REVIEW OF SYSTEMS:   Obtunded and critically ill, unable to obtain ROS     VITAL SIGNS: BP 120/69   Pulse 76   Temp 99.9 F (37.7 C)   Resp 15   Ht 5\' 9"  (1.753 m)   Wt 176 lb 2.4 oz (79.9 kg)   SpO2 95%   BMI 26.01 kg/m       VENTILATOR SETTINGS: Vent Mode: PRVC FiO2 (%):  [28 %-30 %] 28 % Set Rate:  [16 bmp] 16 bmp Vt Set:  [500 mL] 500 mL PEEP:  [5 cmH20] 5 cmH20  INTAKE / OUTPUT: I/O last 3 completed shifts: In: 3202.2 [I.V.:3022.2; NG/GT:60; IV Piggyback:120] Out: 2470 [Urine:2220; Other:250]  PHYSICAL EXAMINATION: General: intubated, RASS -2 Neuro: Pupils 4 mm and symmetric, corneal reflexes absent, no spont movement, no withdrawal from pain, DTRs absent HEENT: NCAT, ETT present Cardiovascular: bradycardic, regular, no M noted Lungs: distant BS, slightly prolonged expiratory phase, very distant wheezes Abdomen: soft, NT, no BS noted Ext: trace to 1+ bilateral pretibial edema, R intraosseus line, severe R great toe deformity Skin: burn on chest from DCCV  LABS:  BMET  Recent Labs Lab 01/19/17 0229 01/20/17 0437 01/20/2017 0530   NA 135 135 134*  K 3.4* 3.9 4.2  CL 101 103 102  CO2 27 27 25   BUN 56* 52* 54*  CREATININE 2.54* 2.76* 2.62*  GLUCOSE 276* 162* 122*    Electrolytes  Recent Labs Lab 01/19/17 0229 01/20/17 0437 01/03/2017 0530  CALCIUM 7.6* 7.8* 7.8*  MG  --  2.0  --   PHOS  --  3.1  --     CBC  Recent Labs Lab Nov 30, 2017 1246 01/19/17 0229 01/20/17 0437  WBC 4.5 6.5 6.4  HGB 13.3 11.7* 12.5*  HCT 42.5 35.6* 37.9*  PLT 65* 51* 54*    Coag's No results for input(s): APTT, INR in the last 168 hours.  Sepsis Markers  Recent Labs Lab Nov 30, 2017 1246 Nov 30, 2017 1836 01/20/17 0208 01/20/17 0437 01/23/2017 0530  LATICACIDVEN 5.1* 2.1*  --  1.7  --   PROCALCITON  --   --  7.63  --  3.25    ABG  Recent Labs Lab Nov 30, 2017 1246 Nov 30, 2017 1721  PHART 7.06* 7.25*  PCO2ART 75* 62*  PO2ART 198* 95    Liver Enzymes  Recent Labs Lab Nov 30, 2017 1246  AST 503*  ALT 321*  ALKPHOS 43  BILITOT 0.6  ALBUMIN 3.1*    Cardiac Enzymes  Recent Labs Lab 01/19/17 0827 01/19/17 1704 01/19/17 2030  TROPONINI 1.52* 0.93* 0.81*    Glucose  Recent Labs Lab 01/20/17 1209 01/20/17 1713 01/20/17 1957 01/20/17 2359 01/05/2017 0355 01/12/2017 [id number]  GLUCAP 118* 133* 107* 124* 115* 98    CXR: CM, no edema or infiltrates EKG: NS IVCD  ASSESSMENT / PLAN:3M NH resident with multiple medical problems including severe COPD, dilated cardiomyopathy recently diagnosed with influenza and started on oseltamivir suffered cardiac arrest at [facility] with no antecedent symptoms reported. Initial rhythm PEA. Total of approx 30 mins prior to ROSC-now with multiorgan failure and signs of severe anoxic brain injury  PULMONARY A: Severe COPD @ baseline - prior PFTs from 2015 reveal FEV1 25% Vent dependence after cardiac arrest P:   Continue vent support Vent bundle implemented Scheduled and PRN bronchodilators  CARDIOVASCULAR A:  Dilated CM - echocardiogram 2015 with LVEF 20-25% Cardiac arrest Prior  history of LBBB Intraventricular conduction delay Cardiogenic shock Elevated lactate - not due to sepsis P:  Norepinephrine to maintain MAP > 65 mmHg Cardiology consult requested (Has seen Gollan in past) No indication for repeat lactate measurements  RENAL A:   AKI  CKD, NOS P:   Monitor BMET intermittently Monitor I/Os Correct electrolytes as indicated Maintenance IVFs  GASTROINTESTINAL A:   No issues P:   SUP: IV famotidine -TFs 01/19/17  HEMATOLOGIC A:   Mild thrombocytopenia (acute on chronic) P:  DVT px: SQ heparin Monitor CBC intermittently Transfuse per usual guidelines Monitor plts closely while on SQ heparin  INFECTIOUS A:   Recent dx of influenza No evidence of bacterial infection P:   Monitor temp, WBC count Micro and abx as above  ENDOCRINE A:   DM 2 P:   Mod scale SSI q 4 hrs  NEUROLOGIC A:   Post anoxic encephalopathy - prognosis guarded P:   RASS goal: -1, -2 PAD protocol - intermittent fent and midaz   FAMILY UPDATE: 1/26 updated daughter in [place]  I have personally obtained a history, examined the patient, evaluated Pertinent laboratory and RadioGraphic/imaging results, and  formulated the assessment and plan   The Patient requires high complexity decision making for assessment and support, frequent evaluation and titration of therapies, application of advanced monitoring technologies and extensive interpretation of multiple databases. Critical Care Time devoted to patient care services described in this note is 35 minutes.   Overall, patient is critically ill, prognosis is guarded.  Patient with Multiorgan failure and at high risk for cardiac arrest and death.    Findings concerning for anoxic brain injury in setting of multiorgan failure Patient now DNR, awaiting Neuro consult and assess for Comfort care measures  Trinten Boudoin Santiago Glad, M.D.  [facility]  Medical Director [facility]  [facility] Medical Director [facility] Cardio-Pulmonary Department

## 2017-01-25 NOTE — Progress Notes (Signed)
I have discussed case with [place], the daughter in [place], she used to be a Engineer, civil (consulting)nurse and she understands her father's grave prognosis. SHe has agreed and fully consented to Comfort care measures.  At this time, I have instructed the daughter to call any family members to come to bedside and will proceed with Comfort care measures.    Family are satisfied with Plan of action and management. All questions answered  Lucie LeatherKurian David Kshawn Canal, M.D.  [facility]  Medical Director [facility] Medical Director [facility] Cardio-Pulmonary Department

## 2017-01-25 NOTE — Discharge Summary (Signed)
DEATH SUMMARY PULMONARY / CRITICAL CARE MEDICINE   Name: [name] MRN: [id number] DOB: 1930/09/10    ADMISSION DATE:  01/20/2017 ADMIT DX CARDIAC ARREST DISCHARGE DX CARDIAC ARREST RESP FAILURE +FLU ANOXIC BRAIN INJURY   PT PROFILE:   80M NH resident with multiple medical problems including severe COPD, dilated cardiomyopathy recently diagnosed with influenza and started on oseltamivir suffered cardiac arrest at [facility] with no antecedent symptoms reported. Initial rhythm PEA. Total of approx 30 mins prior to ROSC  MAJOR EVENTS/TEST RESULTS: 01/10/2017: Admitted via ED after out of hospital cardiac arrests 1/26 DNR placed 1/27-1/28 signs of severe anoxic brain injury  INDWELLING DEVICES:: ETT 01/06/2017 >>  R femoral CVL 12/31/2016 >>    I have discussed case with [place], the daughter in [place], she used to be a Marine scientist and she understands her father's grave prognosis. SHe has agreed and fully consented to Comfort care measures.  At this time, I have instructed the daughter to call any family members to come to bedside and will proceed with Comfort care measures.   I have met with Brother,sisters and niece of patient, they understand his grave prognosis They have also agreed and will proceed with Comfort care measures    Patient pronounced dead at 1417 on February 01, 2016   Corrin Parker, M.D.  [facility]  Medical Director [place] Director [facility] Cardio-Pulmonary Department

## 2017-01-25 NOTE — Progress Notes (Signed)
Pt extubated and comfort care measures put in place at this time. Family was in room during extubation per their request; however, left room after extubation and stated they would not be returning. Pt's sister requested to be called once pt dies.

## 2017-01-25 DEATH — deceased

## 2017-08-20 ENCOUNTER — Ambulatory Visit (INDEPENDENT_AMBULATORY_CARE_PROVIDER_SITE_OTHER): Payer: Self-pay | Admitting: Vascular Surgery

## 2017-08-20 ENCOUNTER — Encounter (INDEPENDENT_AMBULATORY_CARE_PROVIDER_SITE_OTHER): Payer: Self-pay
